# Patient Record
Sex: Female | Born: 1990
Health system: Southern US, Community
[De-identification: ages and names within clinical notes are randomized; demographics above are authoritative.]

## PROBLEM LIST (undated history)

## (undated) ENCOUNTER — Inpatient Hospital Stay (HOSPITAL_COMMUNITY): Payer: Self-pay

## (undated) DIAGNOSIS — N289 Disorder of kidney and ureter, unspecified: Secondary | ICD-10-CM

## (undated) DIAGNOSIS — IMO0002 Reserved for concepts with insufficient information to code with codable children: Secondary | ICD-10-CM

## (undated) DIAGNOSIS — E669 Obesity, unspecified: Secondary | ICD-10-CM

## (undated) DIAGNOSIS — R87619 Unspecified abnormal cytological findings in specimens from cervix uteri: Secondary | ICD-10-CM

## (undated) DIAGNOSIS — A749 Chlamydial infection, unspecified: Secondary | ICD-10-CM

## (undated) DIAGNOSIS — R87629 Unspecified abnormal cytological findings in specimens from vagina: Secondary | ICD-10-CM

## (undated) DIAGNOSIS — I10 Essential (primary) hypertension: Secondary | ICD-10-CM

## (undated) DIAGNOSIS — N200 Calculus of kidney: Secondary | ICD-10-CM

## (undated) HISTORY — PX: NO PAST SURGERIES: SHX2092

---
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
## 2009-05-26 ENCOUNTER — Emergency Department (HOSPITAL_COMMUNITY): Admission: EM | Admit: 2009-05-26 | Discharge: 2009-05-26 | Payer: Self-pay | Admitting: Emergency Medicine

## 2009-08-31 ENCOUNTER — Emergency Department (HOSPITAL_COMMUNITY): Admission: EM | Admit: 2009-08-31 | Discharge: 2009-08-31 | Payer: Self-pay | Admitting: Emergency Medicine

## 2010-04-23 ENCOUNTER — Emergency Department (HOSPITAL_COMMUNITY): Admission: EM | Admit: 2010-04-23 | Discharge: 2010-04-23 | Payer: Self-pay | Admitting: Emergency Medicine

## 2011-01-03 LAB — URINALYSIS, ROUTINE W REFLEX MICROSCOPIC
Bilirubin Urine: NEGATIVE
Glucose, UA: NEGATIVE mg/dL
Ketones, ur: NEGATIVE mg/dL
Leukocytes, UA: NEGATIVE
Nitrite: NEGATIVE
Protein, ur: NEGATIVE mg/dL
Specific Gravity, Urine: 1.023 (ref 1.005–1.030)
Urobilinogen, UA: 0.2 mg/dL (ref 0.0–1.0)
pH: 7 (ref 5.0–8.0)

## 2011-01-03 LAB — URINE MICROSCOPIC-ADD ON

## 2011-01-03 LAB — WET PREP, GENITAL

## 2011-01-03 LAB — RPR: RPR Ser Ql: NONREACTIVE

## 2011-01-03 LAB — GC/CHLAMYDIA PROBE AMP, GENITAL: Chlamydia, DNA Probe: NEGATIVE

## 2011-01-20 LAB — URINALYSIS, ROUTINE W REFLEX MICROSCOPIC
Bilirubin Urine: NEGATIVE
Ketones, ur: NEGATIVE mg/dL
Nitrite: NEGATIVE
Urobilinogen, UA: 0.2 mg/dL (ref 0.0–1.0)

## 2011-01-20 LAB — COMPREHENSIVE METABOLIC PANEL
Albumin: 3.3 g/dL — ABNORMAL LOW (ref 3.5–5.2)
Alkaline Phosphatase: 38 U/L — ABNORMAL LOW (ref 39–117)
BUN: 9 mg/dL (ref 6–23)
Chloride: 107 mEq/L (ref 96–112)
Glucose, Bld: 93 mg/dL (ref 70–99)
Potassium: 3.8 mEq/L (ref 3.5–5.1)
Total Bilirubin: 0.5 mg/dL (ref 0.3–1.2)

## 2011-01-20 LAB — CBC
HCT: 29.5 % — ABNORMAL LOW (ref 36.0–46.0)
Hemoglobin: 9.9 g/dL — ABNORMAL LOW (ref 12.0–15.0)
Platelets: 230 10*3/uL (ref 150–400)
WBC: 5.8 10*3/uL (ref 4.0–10.5)

## 2011-01-20 LAB — DIFFERENTIAL
Basophils Absolute: 0.1 10*3/uL (ref 0.0–0.1)
Eosinophils Absolute: 0.1 10*3/uL (ref 0.0–0.7)
Lymphocytes Relative: 19 % (ref 12–46)
Monocytes Absolute: 0.4 10*3/uL (ref 0.1–1.0)
Neutro Abs: 4.1 10*3/uL (ref 1.7–7.7)

## 2011-01-20 LAB — PREGNANCY, URINE: Preg Test, Ur: NEGATIVE

## 2011-01-23 LAB — POCT RAPID STREP A (OFFICE): Streptococcus, Group A Screen (Direct): POSITIVE — AB

## 2011-01-29 ENCOUNTER — Emergency Department (HOSPITAL_COMMUNITY)
Admission: EM | Admit: 2011-01-29 | Discharge: 2011-01-29 | Disposition: A | Discharge status: Home / Self Care | Payer: Self-pay | Attending: Emergency Medicine | Admitting: Emergency Medicine

## 2011-01-29 DIAGNOSIS — L509 Urticaria, unspecified: Secondary | ICD-10-CM | POA: Insufficient documentation

## 2011-01-29 DIAGNOSIS — R21 Rash and other nonspecific skin eruption: Secondary | ICD-10-CM | POA: Insufficient documentation

## 2011-01-29 DIAGNOSIS — E669 Obesity, unspecified: Secondary | ICD-10-CM | POA: Insufficient documentation

## 2011-03-27 ENCOUNTER — Inpatient Hospital Stay (INDEPENDENT_AMBULATORY_CARE_PROVIDER_SITE_OTHER)
Admission: RE | Admit: 2011-03-27 | Discharge: 2011-03-27 | Disposition: A | Discharge status: Home / Self Care | Payer: Self-pay | Source: Ambulatory Visit | Attending: Emergency Medicine | Admitting: Emergency Medicine

## 2011-03-27 DIAGNOSIS — N760 Acute vaginitis: Secondary | ICD-10-CM

## 2011-03-27 LAB — POCT URINALYSIS DIP (DEVICE)
Hgb urine dipstick: NEGATIVE
Protein, ur: 30 mg/dL — AB
Specific Gravity, Urine: >=1.03 (ref 1.005–1.030)
Urobilinogen, UA: 0.2 mg/dL (ref 0.0–1.0)
pH: 6 (ref 5.0–8.0)

## 2011-03-27 LAB — POCT PREGNANCY, URINE: Preg Test, Ur: NEGATIVE

## 2011-03-27 LAB — WET PREP, GENITAL

## 2011-03-30 LAB — GC/CHLAMYDIA PROBE AMP, GENITAL
Chlamydia, DNA Probe: POSITIVE — AB
GC Probe Amp, Genital: NEGATIVE

## 2011-09-01 ENCOUNTER — Encounter: Payer: Self-pay | Admitting: *Deleted

## 2011-09-01 ENCOUNTER — Emergency Department (HOSPITAL_COMMUNITY)
Admission: EM | Admit: 2011-09-01 | Discharge: 2011-09-01 | Disposition: A | Discharge status: Home / Self Care | Payer: Self-pay | Attending: Emergency Medicine | Admitting: Emergency Medicine

## 2011-09-01 DIAGNOSIS — R112 Nausea with vomiting, unspecified: Secondary | ICD-10-CM | POA: Insufficient documentation

## 2011-09-01 DIAGNOSIS — D649 Anemia, unspecified: Secondary | ICD-10-CM | POA: Insufficient documentation

## 2011-09-01 DIAGNOSIS — R1084 Generalized abdominal pain: Secondary | ICD-10-CM | POA: Insufficient documentation

## 2011-09-01 LAB — CBC
Platelets: 234 10*3/uL (ref 150–400)
RBC: 3.89 MIL/uL (ref 3.87–5.11)
RDW: 15 % (ref 11.5–15.5)
WBC: 5.7 10*3/uL (ref 4.0–10.5)

## 2011-09-01 LAB — URINALYSIS, ROUTINE W REFLEX MICROSCOPIC
Glucose, UA: NEGATIVE mg/dL
Hgb urine dipstick: NEGATIVE
Ketones, ur: NEGATIVE mg/dL
Protein, ur: NEGATIVE mg/dL
pH: 7 (ref 5.0–8.0)

## 2011-09-01 LAB — COMPREHENSIVE METABOLIC PANEL
ALT: 10 U/L (ref 0–35)
AST: 14 U/L (ref 0–37)
Albumin: 3.4 g/dL — ABNORMAL LOW (ref 3.5–5.2)
CO2: 25 mEq/L (ref 19–32)
Chloride: 105 mEq/L (ref 96–112)
GFR calc non Af Amer: >90 mL/min (ref >90)
Potassium: 3.8 mEq/L (ref 3.5–5.1)
Sodium: 137 mEq/L (ref 135–145)
Total Bilirubin: 0.3 mg/dL (ref 0.3–1.2)

## 2011-09-01 LAB — POCT PREGNANCY, URINE: Preg Test, Ur: NEGATIVE

## 2011-09-01 MED ORDER — ONDANSETRON 4 MG PO TBDP
8.0000 mg | ORAL_TABLET | Freq: Once | ORAL | Status: AC
  Administered 2011-09-01: 8 mg via ORAL
  Filled 2011-09-01: qty 2

## 2011-09-01 MED ORDER — ONDANSETRON HCL 4 MG PO TABS: 4.0000 mg | ORAL_TABLET | Freq: Four times a day (QID) | ORAL | Status: AC

## 2011-09-01 NOTE — ED Provider Notes (Signed)
History     CSN: [id number] Arrival date & time: 09/01/2011  8:50 AM   First MD Initiated Contact with Patient 09/01/11 [phone]      Chief Complaint  Patient presents with  . Abdominal Pain  . Emesis    (Consider location/radiation/quality/duration/timing/severity/associated sxs/prior treatment) Patient is a 20 y.o. female presenting with abdominal pain. The history is provided by the patient and the spouse.  Abdominal Pain The primary symptoms of the illness include abdominal pain, nausea and vomiting. The primary symptoms of the illness do not include fever. Primary symptoms comment: Vomiting and abdominal cramping The current episode started 13 to 24 hours ago. The onset of the illness was sudden. Progression since onset: The abdominal cramping has stayed the same lobe the vomiting has not occurred since 9 AM.  The abdominal pain began 13 to24 hours ago. The pain came on suddenly. The abdominal pain has been unchanged since its onset. The abdominal pain is generalized. The abdominal pain does not radiate. Pain scale: Pain is moderate. The abdominal pain is relieved by nothing. The abdominal pain is exacerbated by vomiting.  Associated with: Associated with nothing. Pregnant Now: Patient does not know she is pregnant. Her menses is one week late and she does not use any protection during intercourse. Symptoms associated with the illness do not include chills, anorexia, diaphoresis, heartburn, constipation, urgency, hematuria, frequency or back pain. Associated medical issues comments: Patient has no associated medical issues..    History reviewed. No pertinent past medical history.  History reviewed. No pertinent past surgical history.  History reviewed. No pertinent family history.  History  Substance Use Topics  . Smoking status: Current Everyday Smoker -- 0.5 packs/day    Types: Cigarettes  . Smokeless tobacco: Not on file  . Alcohol Use: No     Review of Systems    Constitutional: Negative for fever, chills and diaphoresis.  HENT: Negative for hearing loss, congestion, neck pain and neck stiffness.   Eyes: Negative for pain and visual disturbance.  Respiratory: Negative for cough and chest tightness.   Cardiovascular: Negative for chest pain, palpitations and leg swelling.  Gastrointestinal: Positive for nausea, vomiting and abdominal pain. Negative for heartburn, constipation and anorexia.  Genitourinary: Negative for urgency, frequency and hematuria.  Musculoskeletal: Negative for back pain.  Skin: Negative for pallor, rash and wound.  Neurological: Negative for seizures, syncope, weakness and headaches.  Hematological: Does not bruise/bleed easily.  Psychiatric/Behavioral: Negative for behavioral problems and confusion.    Allergies  Review of patient's allergies indicates no known allergies.  Home Medications  No current outpatient prescriptions on file.  BP 139/86  Pulse 63  Temp(Src) 98 F (36.7 C) (Oral)  Resp 16  SpO2 100%  LMP 07/28/2011  Physical Exam  Constitutional: She is oriented to person, place, and time. She appears well-developed and well-nourished. No distress.  HENT:  Head: Normocephalic and atraumatic.  Mouth/Throat: Oropharynx is clear and moist.  Eyes: Conjunctivae are normal. Pupils are equal, round, and reactive to light.  Neck: Normal range of motion. Neck supple.  Cardiovascular: Normal rate, regular rhythm, normal heart sounds and intact distal pulses.   Pulmonary/Chest: Effort normal and breath sounds normal.  Abdominal: Soft. She exhibits no distension.       There is mild bilateral upper abdominal tenderness to palpation.  Musculoskeletal: Normal range of motion. She exhibits no edema and no tenderness.  Lymphadenopathy:    She has no cervical adenopathy.  Neurological: She is alert and oriented to person,  place, and time. Coordination normal.  Skin: Skin is warm and dry. No rash noted.  Psychiatric:  She has a normal mood and affect. Her behavior is normal.    ED Course  Procedures (including critical care time)  Labs Reviewed  CBC - Abnormal; Notable for the following:    Hemoglobin 10.6 (*)    HCT 32.9 (*)    All other components within normal limits  COMPREHENSIVE METABOLIC PANEL - Abnormal; Notable for the following:    Albumin 3.4 (*)    All other components within normal limits  URINALYSIS, ROUTINE W REFLEX MICROSCOPIC - Abnormal; Notable for the following:    Appearance HAZY (*)    All other components within normal limits  LIPASE, BLOOD  POCT PREGNANCY, URINE  POCT PREGNANCY, URINE   No results found.   1. Nausea and vomiting       MDM  Patient's labs are reviewed and are significant only for mild anemia. Her abdomen is fairly benign on examination. She has not vomited since her arrival to the emergency department. Do not feel her symptoms are related to an emergency medical condition. We will give her a by mouth trial of clear fluids and as long as she tolerates this, we will send her home with a prescription of Zofran and a recommendation to return to the emergency department for further evaluation if she has persistent or worsening symptoms.   Medical screening examination/treatment/procedure(s) were performed by non-physician practitioner and as supervising physician I was immediately available for consultation/collaboration. Osvaldo Human, M.D.     [address] [place], [place] 09/01/11 1416  Carleene Cooper III, MD 09/01/11 [phone]

## 2011-09-01 NOTE — ED Notes (Signed)
Having n/v since last night and generalized abd pain.

## 2011-09-01 NOTE — ED Notes (Signed)
Pt with no vomiting since arrival to dept

## 2011-09-01 NOTE — ED Notes (Signed)
Pt given sprite for PO challenge, tolerating well

## 2011-09-01 NOTE — ED Notes (Addendum)
Pt reports right flank pain radiating into generalized upper abdomen with nausea and mult episodes of vomiting. Denies any urinary symptoms. Reports she ate chips and a candy bar for dinner last night, vomiting began at 10pm. Pt appears in no distress. Alert and oriented x 3. Neuro intact.

## 2011-11-17 ENCOUNTER — Encounter (HOSPITAL_COMMUNITY): Payer: Self-pay | Admitting: *Deleted

## 2011-11-17 ENCOUNTER — Emergency Department (HOSPITAL_COMMUNITY)
Admission: EM | Admit: 2011-11-17 | Discharge: 2011-11-17 | Disposition: A | Discharge status: Home / Self Care | Payer: Self-pay | Attending: Emergency Medicine | Admitting: Emergency Medicine

## 2011-11-17 DIAGNOSIS — N946 Dysmenorrhea, unspecified: Secondary | ICD-10-CM | POA: Insufficient documentation

## 2011-11-17 DIAGNOSIS — N898 Other specified noninflammatory disorders of vagina: Secondary | ICD-10-CM | POA: Insufficient documentation

## 2011-11-17 DIAGNOSIS — N949 Unspecified condition associated with female genital organs and menstrual cycle: Secondary | ICD-10-CM | POA: Insufficient documentation

## 2011-11-17 HISTORY — DX: Obesity, unspecified: E66.9

## 2011-11-17 NOTE — ED Provider Notes (Signed)
History     CSN: [id number]  Arrival date & time 11/17/11  1426   First MD Initiated Contact with Patient 11/17/11 1444      Chief Complaint  Patient presents with  . Vaginal Bleeding    (Consider location/radiation/quality/duration/timing/severity/associated sxs/prior treatment) HPI Comments: Patient presents with complaints of severe pelvic cramping and heavy vaginal bleeding which started 3 days ago but has resolved as of this morning. Her last menses ended on 11/09/2011, lasting 5 days and was normal. She typically has regular cycles with no spotting or breakthrough bleeding. She is sexually active, and is not using any birth control. She denies vaginal discharge, dysuria, nausea vomiting or fevers. She reports she just "wanted to get checked out" since her. The last 3 days has been very  heavy with passage of several clots. She is asymptomatic currently.   Patient is a 21 y.o. female presenting with vaginal bleeding. The history is provided by the patient.  Vaginal Bleeding Pertinent negatives include no abdominal pain, arthralgias, chest pain, congestion, fever, headaches, joint swelling, nausea, neck pain, numbness, rash, sore throat or weakness.    Past Medical History  Diagnosis Date  . Obesity     History reviewed. No pertinent past surgical history.  History reviewed. No pertinent family history.  History  Substance Use Topics  . Smoking status: Former Smoker -- 0.5 packs/day    Types: Cigarettes  . Smokeless tobacco: Not on file  . Alcohol Use: No    OB History    Grav Para Term Preterm Abortions TAB SAB Ect Mult Living                  Review of Systems  Constitutional: Negative for fever.  HENT: Negative for congestion, sore throat and neck pain.   Eyes: Negative.   Respiratory: Negative for chest tightness and shortness of breath.   Cardiovascular: Negative for chest pain.  Gastrointestinal: Negative for nausea and abdominal pain.  Genitourinary:  Positive for vaginal bleeding and menstrual problem. Negative for vaginal discharge and vaginal pain.  Musculoskeletal: Negative for joint swelling and arthralgias.  Skin: Negative.  Negative for rash and wound.  Neurological: Negative for dizziness, weakness, light-headedness, numbness and headaches.  Hematological: Negative.   Psychiatric/Behavioral: Negative.     Allergies  Review of patient's allergies indicates no known allergies.  Home Medications  No current outpatient prescriptions on file.  BP 130/71  Pulse 67  Temp(Src) 98.3 F (36.8 C) (Oral)  Resp 18  SpO2 99%  LMP 11/05/2011  Physical Exam  Nursing note and vitals reviewed. Constitutional: She is oriented to person, place, and time. She appears well-developed and well-nourished.  HENT:  Head: Normocephalic and atraumatic.  Eyes: Conjunctivae are normal.  Neck: Normal range of motion.  Cardiovascular: Normal rate, regular rhythm, normal heart sounds and intact distal pulses.   Pulmonary/Chest: Effort normal and breath sounds normal. She has no wheezes.  Abdominal: Soft. Bowel sounds are normal. She exhibits no distension. There is no tenderness. There is no rebound.  Musculoskeletal: Normal range of motion.  Neurological: She is alert and oriented to person, place, and time.  Skin: Skin is warm and dry.  Psychiatric: She has a normal mood and affect.    ED Course  Procedures (including critical care time)    1. Dysmenorrhea       MDM  Urine pregnancy negative.   Hemoglobin per Istat 11.2.    Patient without symptoms today,  No vaginal bleeding,  Pain or other  symptoms.  Will refer for general GYN care and for recheck if pt develops pattern of dysmenorrhea.        Candis Musa, PA 11/17/11 1652  Candis Musa, PA 11/17/11 1702

## 2011-11-17 NOTE — ED Provider Notes (Signed)
Medical screening examination/treatment/procedure(s) were performed by non-physician practitioner and as supervising physician I was immediately available for consultation/collaboration.  Juliet Rude. Rubin Payor, MD 11/17/11 2355

## 2011-11-17 NOTE — ED Notes (Signed)
Pt reports having severe cramps yesterday and heavy vaginal bleeding with clots.

## 2012-03-22 ENCOUNTER — Emergency Department (HOSPITAL_COMMUNITY)
Admission: EM | Admit: 2012-03-22 | Discharge: 2012-03-22 | Disposition: A | Discharge status: Home / Self Care | Payer: Self-pay | Attending: Emergency Medicine | Admitting: Emergency Medicine

## 2012-03-22 ENCOUNTER — Encounter (HOSPITAL_COMMUNITY): Payer: Self-pay

## 2012-03-22 ENCOUNTER — Emergency Department (HOSPITAL_COMMUNITY): Payer: Self-pay

## 2012-03-22 DIAGNOSIS — Z3490 Encounter for supervision of normal pregnancy, unspecified, unspecified trimester: Secondary | ICD-10-CM

## 2012-03-22 DIAGNOSIS — E669 Obesity, unspecified: Secondary | ICD-10-CM | POA: Insufficient documentation

## 2012-03-22 DIAGNOSIS — R109 Unspecified abdominal pain: Secondary | ICD-10-CM | POA: Insufficient documentation

## 2012-03-22 DIAGNOSIS — N898 Other specified noninflammatory disorders of vagina: Secondary | ICD-10-CM | POA: Insufficient documentation

## 2012-03-22 DIAGNOSIS — B373 Candidiasis of vulva and vagina: Secondary | ICD-10-CM

## 2012-03-22 DIAGNOSIS — F17200 Nicotine dependence, unspecified, uncomplicated: Secondary | ICD-10-CM | POA: Insufficient documentation

## 2012-03-22 DIAGNOSIS — B3731 Acute candidiasis of vulva and vagina: Secondary | ICD-10-CM | POA: Insufficient documentation

## 2012-03-22 DIAGNOSIS — O2390 Unspecified genitourinary tract infection in pregnancy, unspecified trimester: Secondary | ICD-10-CM | POA: Insufficient documentation

## 2012-03-22 HISTORY — DX: Disorder of kidney and ureter, unspecified: N28.9

## 2012-03-22 LAB — URINALYSIS, ROUTINE W REFLEX MICROSCOPIC
Glucose, UA: NEGATIVE mg/dL
Protein, ur: NEGATIVE mg/dL
Specific Gravity, Urine: 1.028 (ref 1.005–1.030)

## 2012-03-22 LAB — POCT PREGNANCY, URINE: Preg Test, Ur: POSITIVE — AB

## 2012-03-22 LAB — WET PREP, GENITAL: Trich, Wet Prep: NONE SEEN

## 2012-03-22 LAB — HCG, QUANTITATIVE, PREGNANCY: hCG, Beta Chain, Quant, S: 962 m[IU]/mL — ABNORMAL HIGH (ref <5)

## 2012-03-22 LAB — URINE MICROSCOPIC-ADD ON

## 2012-03-22 MED ORDER — FLUCONAZOLE 150 MG PO TABS: 150.0000 mg | ORAL_TABLET | Freq: Once | ORAL | Status: DC

## 2012-03-22 MED ORDER — FLUCONAZOLE 150 MG PO TABS: 150.0000 mg | ORAL_TABLET | Freq: Once | ORAL | Status: AC

## 2012-03-22 NOTE — ED Notes (Signed)
Left lower abd pain since Friday states took 3 home preg tests and they were [positive and she wants to be sure LMP was 02/17/12 has a d/c also

## 2012-03-22 NOTE — Discharge Instructions (Signed)
Candida Infection, Adult A candida infection (also called yeast, fungus and Monilia infection) is an overgrowth of yeast that can occur anywhere on the body. A yeast infection commonly occurs in warm, moist body areas. Usually, the infection remains localized but can spread to become a systemic infection. A yeast infection may be a sign of a more severe disease such as diabetes, leukemia, or AIDS. A yeast infection can occur in both men and women. In women, Candida vaginitis is a vaginal infection. It is one of the most common causes of vaginitis. Men usually do not have symptoms or know they have an infection until other problems develop. Men may find out they have a yeast infection because their sex partner has a yeast infection. Uncircumcised men are more likely to get a yeast infection than circumcised men. This is because the uncircumcised glans is not exposed to air and does not remain as dry as that of a circumcised glans. Older adults may develop yeast infections around dentures. CAUSES  Women  Antibiotics.   Steroid medication taken for a long time.   Being overweight (obese).   Diabetes.   Poor immune condition.   Certain serious medical conditions.   Immune suppressive medications for organ transplant patients.   Chemotherapy.   Pregnancy.   Menstration.   Stress and fatigue.   Intravenous drug use.   Oral contraceptives.   Wearing tight-fitting clothes in the crotch area.   Catching it from a sex partner who has a yeast infection.   Spermicide.   Intravenous, urinary, or other catheters.  Men  Catching it from a sex partner who has a yeast infection.   Having oral or anal sex with a person who has the infection.   Spermicide.   Diabetes.   Antibiotics.   Poor immune system.   Medications that suppress the immune system.   Intravenous drug use.   Intravenous, urinary, or other catheters.  SYMPTOMS  Women  Thick, white vaginal discharge.    Vaginal itching.   Redness and swelling in and around the vagina.   Irritation of the lips of the vagina and perineum.   Blisters on the vaginal lips and perineum.   Painful sexual intercourse.   Low blood sugar (hypoglycemia).   Painful urination.   Bladder infections.   Intestinal problems such as constipation, indigestion, bad breath, bloating, increase in gas, diarrhea, or loose stools.  Men  Men may develop intestinal problems such as constipation, indigestion, bad breath, bloating, increase in gas, diarrhea, or loose stools.   Dry, cracked skin on the penis with itching or discomfort.   Jock itch.   Dry, flaky skin.   Athlete's foot.   Hypoglycemia.  DIAGNOSIS  Women  A history and an exam are performed.   The discharge may be examined under a microscope.   A culture may be taken of the discharge.  Men  A history and an exam are performed.   Any discharge from the penis or areas of cracked skin will be looked at under the microscope and cultured.   Stool samples may be cultured.  TREATMENT  Women  Vaginal antifungal suppositories and creams.   Medicated creams to decrease irritation and itching on the outside of the vagina.   Warm compresses to the perineal area to decrease swelling and discomfort.   Oral antifungal medications.   Medicated vaginal suppositories or cream for repeated or recurrent infections.   Wash and dry the irritation areas before applying the cream.     Eating yogurt with lactobacillus may help with prevention and treatment.   Sometimes painting the vagina with gentian violet solution may help if creams and suppositories do not work.  Men  Antifungal creams and oral antifungal medications.   Sometimes treatment must continue for 30 days after the symptoms go away to prevent recurrence.  HOME CARE INSTRUCTIONS  Women  Use cotton underwear and avoid tight-fitting clothing.   Avoid colored, scented toilet paper and  deodorant tampons or pads.   Do not douche.   Keep your diabetes under control.   Finish all the prescribed medications.   Keep your skin clean and dry.   Consume milk or yogurt with lactobacillus active culture regularly. If you get frequent yeast infections and think that is what the infection is, there are over-the-counter medications that you can get. If the infection does not show healing in 3 days, talk to your caregiver.   Tell your sex partner you have a yeast infection. Your partner may need treatment also, especially if your infection does not clear up or recurs.  Men  Keep your skin clean and dry.   Keep your diabetes under control.   Finish all prescribed medications.   Tell your sex partner that you have a yeast infection so they can be treated if necessary.  SEEK MEDICAL CARE IF:   Your symptoms do not clear up or worsen in one week after treatment.   You have an oral temperature above 102 F (38.9 C).   You have trouble swallowing or eating for a prolonged time.   You develop blisters on and around your vagina.   You develop vaginal bleeding and it is not your menstrual period.   You develop abdominal pain.   You develop intestinal problems as mentioned above.   You get weak or lightheaded.   You have painful or increased urination.   You have pain during sexual intercourse.  MAKE SURE YOU:   Understand these instructions.   Will watch your condition.   Will get help right away if you are not doing well or get worse.  Document Released: 11/11/2004 Document Revised: 09/23/2011 Document Reviewed: 02/23/2010 [facility] Patient Information 2012 [place], [place].ABCs of Pregnancy A Antepartum care is very important. Be sure you see your doctor and get prenatal care as soon as you think you are pregnant. At this time, you will be tested for infection, genetic abnormalities and potential problems with you and the pregnancy. This is the time to discuss diet,  exercise, work, medications, labor, pain medication during labor and the possibility of a cesarean delivery. Ask any questions that may concern you. It is important to see your doctor regularly throughout your pregnancy. Avoid exposure to toxic substances and chemicals - such as cleaning solvents, lead and mercury, some insecticides, and paint. Pregnant women should avoid exposure to paint fumes, and fumes that cause you to feel ill, dizzy or faint. When possible, it is a good idea to have a pre-pregnancy consultation with your caregiver to begin some important recommendations your caregiver suggests such as, taking folic acid, exercising, quitting smoking, avoiding alcoholic beverages, etc. B Breastfeeding is the healthiest choice for both you and your baby. It has many nutritional benefits for the baby and health benefits for the mother. It also creates a very tight and loving bond between the baby and mother. Talk to your doctor, your family and friends, and your employer about how you choose to feed your baby and how they can support you  in your decision. Not all birth defects can be prevented, but a woman can take actions that may increase her chance of having a healthy baby. Many birth defects happen very early in pregnancy, sometimes before a woman even knows she is pregnant. Birth defects or abnormalities of any child in your or the father's family should be discussed with your caregiver. Get a good support bra as your breast size changes. Wear it especially when you exercise and when nursing.  C Celebrate the news of your pregnancy with the your spouse/father and family. Childbirth classes are helpful to take for you and the spouse/father because it helps to understand what happens during the pregnancy, labor and delivery. Cesarean delivery should be discussed with your doctor so you are prepared for that possibility. The pros and cons of circumcision if it is a boy, should be discussed with your  pediatrician. Cigarette smoking during pregnancy can result in low birth weight babies. It has been associated with infertility, miscarriages, tubal pregnancies, infant death (mortality) and poor health (morbidity) in childhood. Additionally, cigarette smoking may cause long-term learning disabilities. If you smoke, you should try to quit before getting pregnant and not smoke during the pregnancy. Secondary smoke may also harm a mother and her developing baby. It is a good idea to ask people to stop smoking around you during your pregnancy and after the baby is born. Extra calcium is necessary when you are pregnant and is found in your prenatal vitamin, in dairy products, green leafy vegetables and in calcium supplements. D A healthy diet according to your current weight and height, along with vitamins and mineral supplements should be discussed with your caregiver. Domestic abuse or violence should be made known to your doctor right away to get the situation corrected. Drink more water when you exercise to keep hydrated. Discomfort of your back and legs usually develops and progresses from the middle of the second trimester through to delivery of the baby. This is because of the enlarging baby and uterus, which may also affect your balance. Do not take illegal drugs. Illegal drugs can seriously harm the baby and you. Drink extra fluids (water is best) throughout pregnancy to help your body keep up with the increases in your blood volume. Drink at least 6 to 8 glasses of water, fruit juice, or milk each day. A good way to know you are drinking enough fluid is when your urine looks almost like clear water or is very light yellow.  E Eat healthy to get the nutrients you and your unborn baby need. Your meals should include the five basic food groups. Exercise (30 minutes of light to moderate exercise a day) is important and encouraged during pregnancy, if there are no medical problems or problems with the  pregnancy. Exercise that causes discomfort or dizziness should be stopped and reported to your caregiver. Emotions during pregnancy can change from being ecstatic to depression and should be understood by you, your partner and your family. F Fetal screening with ultrasound, amniocentesis and monitoring during pregnancy and labor is common and sometimes necessary. Take 400 micrograms of folic acid daily both before, when possible, and during the first few months of pregnancy to reduce the risk of birth defects of the brain and spine. All women who could possibly become pregnant should take a vitamin with folic acid, every day. It is also important to eat a healthy diet with fortified foods (enriched grain products, including cereals, rice, breads, and pastas) and foods with  natural sources of folate (orange juice, green leafy vegetables, beans, peanuts, broccoli, asparagus, peas, and lentils). The father should be involved with all aspects of the pregnancy including, the prenatal care, childbirth classes, labor, delivery, and postpartum time. Fathers may also have emotional concerns about being a father, financial needs, and raising a family. G Genetic testing should be done appropriately. It is important to know your family and the father's history. If there have been problems with pregnancies or birth defects in your family, report these to your doctor. Also, genetic counselors can talk with you about the information you might need in making decisions about having a family. You can call a major medical center in your area for help in finding a board-certified genetic counselor. Genetic testing and counseling should be done before pregnancy when possible, especially if there is a history of problems in the mother's or father's family. Certain ethnic backgrounds are more at risk for genetic defects. H Get familiar with the hospital where you will be having your baby. Get to know how long it takes to get there,  the labor and delivery area, and the hospital procedures. Be sure your medical insurance is accepted there. Get your home ready for the baby including, clothes, the baby's room (when possible), furniture and car seat. Hand washing is important throughout the day, especially after handling raw meat and poultry, changing the baby's diaper or using the bathroom. This can help prevent the spread of many bacteria and viruses that cause infection. Your hair may become dry and thinner, but will return to normal a few weeks after the baby is born. Heartburn is a common problem that can be treated by taking antacids recommended by your caregiver, eating smaller meals 5 or 6 times a day, not drinking liquids when eating, drinking between meals and raising the head of your bed 2 to 3 inches. I Insurance to cover you, the baby, doctor and hospital should be reviewed so that you will be prepared to pay any costs not covered by your insurance plan. If you do not have medical insurance, there are usually clinics and services available for you in your community. Take 30 milligrams of iron during your pregnancy as prescribed by your doctor to reduce the risk of low red blood cells (anemia) later in pregnancy. All women of childbearing age should eat a diet rich in iron. J There should be a joint effort for the mother, father and any other children to adapt to the pregnancy financially, emotionally, and psychologically during the pregnancy. Join a support group for moms-to-be. Or, join a class on parenting or childbirth. Have the family participate when possible. K Know your limits. Let your caregiver know if you experience any of the following:   Pain of any kind.   Strong cramps.   You develop a lot of weight in a short period of time (5 pounds in 3 to 5 days).   Vaginal bleeding, leaking of amniotic fluid.   Headache, vision problems.   Dizziness, fainting, shortness of breath.   Chest pain.   Fever of 102 F  (38.9 C) or higher.   Gush of clear fluid from your vagina.   Painful urination.   Domestic violence.   Irregular heartbeat (palpitations).   Rapid beating of the heart (tachycardia).   Constant feeling sick to your stomach (nauseous) and vomiting.   Trouble walking, fluid retention (edema).   Muscle weakness.   If your baby has decreased activity.   Persistent diarrhea.  Abnormal vaginal discharge.   Uterine contractions at 20-minute intervals.   Back pain that travels down your leg.  L Learn and practice that what you eat and drink should be in moderation and healthy for you and your baby. Legal drugs such as alcohol and caffeine are important issues for pregnant women. There is no safe amount of alcohol a woman can drink while pregnant. Fetal alcohol syndrome, a disorder characterized by growth retardation, facial abnormalities, and central nervous system dysfunction, is caused by a woman's use of alcohol during pregnancy. Caffeine, found in tea, coffee, soft drinks and chocolate, should also be limited. Be sure to read labels when trying to cut down on caffeine during pregnancy. More than 200 foods, beverages, and over-the-counter medications contain caffeine and have a high salt content! There are coffees and teas that do not contain caffeine. M Medical conditions such as diabetes, epilepsy, and high blood pressure should be treated and kept under control before pregnancy when possible, but especially during pregnancy. Ask your caregiver about any medications that may need to be changed or adjusted during pregnancy. If you are currently taking any medications, ask your caregiver if it is safe to take them while you are pregnant or before getting pregnant when possible. Also, be sure to discuss any herbs or vitamins you are taking. They are medicines, too! Discuss with your doctor all medications, prescribed and over-the-counter, that you are taking. During your prenatal visit,  discuss the medications your doctor may give you during labor and delivery. N Never be afraid to ask your doctor or caregiver questions about your health, the progress of the pregnancy, family problems, stressful situations, and recommendation for a pediatrician, if you do not have one. It is better to take all precautions and discuss any questions or concerns you may have during your office visits. It is a good idea to write down your questions before you visit the doctor. O Over-the-counter cough and cold remedies may contain alcohol or other ingredients that should be avoided during pregnancy. Ask your caregiver about prescription, herbs or over-the-counter medications that you are taking or may consider taking while pregnant.  P Physical activity during pregnancy can benefit both you and your baby by lessening discomfort and fatigue, providing a sense of well-being, and increasing the likelihood of early recovery after delivery. Light to moderate exercise during pregnancy strengthens the belly (abdominal) and back muscles. This helps improve posture. Practicing yoga, walking, swimming, and cycling on a stationary bicycle are usually safe exercises for pregnant women. Avoid scuba diving, exercise at high altitudes (over 3000 feet), skiing, horseback riding, contact sports, etc. Always check with your doctor before beginning any kind of exercise, especially during pregnancy and especially if you did not exercise before getting pregnant. Q Queasiness, stomach upset and morning sickness are common during pregnancy. Eating a couple of crackers or dry toast before getting out of bed. Foods that you normally love may make you feel sick to your stomach. You may need to substitute other nutritious foods. Eating 5 or 6 small meals a day instead of 3 large ones may make you feel better. Do not drink with your meals, drink between meals. Questions that you have should be written down and asked during your prenatal  visits. R Read about and make plans to baby-proof your home. There are important tips for making your home a safer environment for your baby. Review the tips and make your home safer for you and your baby. Read food labels  regarding calories, salt and fat content in the food. S Saunas, hot tubs, and steam rooms should be avoided while you are pregnant. Excessive high heat may be harmful during your pregnancy. Your caregiver will screen and examine you for sexually transmitted diseases and genetic disorders during your prenatal visits. Learn the signs of labor. Sexual relations while pregnant is safe unless there is a medical or pregnancy problem and your caregiver advises against it. T Traveling long distances should be avoided especially in the third trimester of your pregnancy. If you do have to travel out of state, be sure to take a copy of your medical records and medical insurance plan with you. You should not travel long distances without seeing your doctor first. Most airlines will not allow you to travel after 36 weeks of pregnancy. Toxoplasmosis is an infection caused by a parasite that can seriously harm an unborn baby. Avoid eating undercooked meat and handling cat litter. Be sure to wear gloves when gardening. Tingling of the hands and fingers is not unusual and is due to fluid retention. This will go away after the baby is born. U Womb (uterus) size increases during the first trimester. Your kidneys will begin to function more efficiently. This may cause you to feel the need to urinate more often. You may also leak urine when sneezing, coughing or laughing. This is due to the growing uterus pressing against your bladder, which lies directly in front of and slightly under the uterus during the first few months of pregnancy. If you experience burning along with frequency of urination or bloody urine, be sure to tell your doctor. The size of your uterus in the third trimester may cause a problem  with your balance. It is advisable to maintain good posture and avoid wearing high heels during this time. An ultrasound of your baby may be necessary during your pregnancy and is safe for you and your baby. V Vaccinations are an important concern for pregnant women. Get needed vaccines before pregnancy. Center for Disease Control ([employer]) has clear guidelines for the use of vaccines during pregnancy. Review the list, be sure to discuss it with your doctor. Prenatal vitamins are helpful and healthy for you and the baby. Do not take extra vitamins except what is recommended. Taking too much of certain vitamins can cause overdose problems. Continuous vomiting should be reported to your caregiver. Varicose veins may appear especially if there is a family history of varicose veins. They should subside after the delivery of the baby. Support hose helps if there is leg discomfort. W Being overweight or underweight during pregnancy may cause problems. Try to get within 15 pounds of your ideal weight before pregnancy. Remember, pregnancy is not a time to be dieting! Do not stop eating or start skipping meals as your weight increases. Both you and your baby need the calories and nutrition you receive from a healthy diet. Be sure to consult with your doctor about your diet. There is a formula and diet plan available depending on whether you are overweight or underweight. Your caregiver or nutritionist can help and advise you if necessary. X Avoid X-rays. If you must have dental work or diagnostic tests, tell your dentist or physician that you are pregnant so that extra care can be taken. X-rays should only be taken when the risks of not taking them outweigh the risk of taking them. If needed, only the minimum amount of radiation should be used. When X-rays are necessary, protective lead shields should  be used to cover areas of the body that are not being X-rayed. Y Your baby loves you. Breastfeeding your baby  creates a loving and very close bond between the two of you. Give your baby a healthy environment to live in while you are pregnant. Infants and children require constant care and guidance. Their health and safety should be carefully watched at all times. After the baby is born, rest or take a nap when the baby is sleeping. Z Get your ZZZs. Be sure to get plenty of rest. Resting on your side as often as possible, especially on your left side is advised. It provides the best circulation to your baby and helps reduce swelling. Try taking a nap for 30 to 45 minutes in the afternoon when possible. After the baby is born rest or take a nap when the baby is sleeping. Try elevating your feet for that amount of time when possible. It helps the circulation in your legs and helps reduce swelling.  Most information courtesy of the CDC. Document Released: 10/04/2005 Document Revised: 09/23/2011 Document Reviewed: 06/18/2009 [facility] Patient Information 2012 [place], [place].

## 2012-03-22 NOTE — ED Notes (Signed)
Pt. Took a home pregnancy test on Friday which is positive , developed sharp vaginal pain with discharge no bleeding

## 2012-03-22 NOTE — ED Provider Notes (Signed)
Medical screening examination/treatment/procedure(s) were performed by non-physician practitioner and as supervising physician I was immediately available for consultation/collaboration.   Forbes Cellar, MD 03/22/12 1309

## 2012-03-22 NOTE — ED Notes (Signed)
Pelvic done w/speculum and bimanual spec to lab 

## 2012-03-22 NOTE — ED Notes (Signed)
Back from us

## 2012-03-22 NOTE — ED Provider Notes (Signed)
History     CSN: [id number]  Arrival date & time 03/22/12  0918   First MD Initiated Contact with Patient 03/22/12 [phone]      Chief Complaint  Patient presents with  . Vaginal Discharge    (Consider location/radiation/quality/duration/timing/severity/associated sxs/prior treatment) Patient is a 21 y.o. female presenting with abdominal pain. The history is provided by the patient. No language interpreter was used.  Abdominal Pain The primary symptoms of the illness include abdominal pain and vaginal discharge. The current episode started 13 to 24 hours ago. The onset of the illness was gradual. The problem has been gradually worsening.  The patient states that she believes she is currently pregnant. The patient has not had a change in bowel habit. Symptoms associated with the illness do not include constipation. Significant associated medical issues do not include diverticulitis.  Pt complains of abdominal pain and vaginal discharge.  Pt had positive home pregnancy.  No prenatal care  Past Medical History  Diagnosis Date  . Obesity   . Renal disorder     History reviewed. No pertinent past surgical history.  No family history on file.  History  Substance Use Topics  . Smoking status: Current Everyday Smoker -- 0.5 packs/day    Types: Cigarettes  . Smokeless tobacco: Not on file  . Alcohol Use: Yes    OB History    Grav Para Term Preterm Abortions TAB SAB Ect Mult Living                  Review of Systems  Gastrointestinal: Positive for abdominal pain. Negative for constipation.  Genitourinary: Positive for vaginal discharge.  All other systems reviewed and are negative.    Allergies  Review of patient's allergies indicates no known allergies.  Home Medications  No current outpatient prescriptions on file.  BP 113/76  Pulse 82  Temp(Src) 98.6 F (37 C) (Oral)  Resp 18  SpO2 100%  LMP 02/17/2012  Physical Exam  Nursing note and vitals  reviewed. Constitutional: She is oriented to person, place, and time. She appears well-developed and well-nourished.  HENT:  Head: Normocephalic and atraumatic.  Right Ear: External ear normal.  Left Ear: External ear normal.  Nose: Nose normal.  Mouth/Throat: Oropharynx is clear and moist.  Eyes: Conjunctivae and EOM are normal. Pupils are equal, round, and reactive to light.  Neck: Normal range of motion. Neck supple.  Cardiovascular: Normal rate and normal heart sounds.   Pulmonary/Chest: Effort normal and breath sounds normal.  Abdominal: Soft. There is tenderness.  Genitourinary: Vaginal discharge found.  Musculoskeletal: Normal range of motion.  Neurological: She is alert and oriented to person, place, and time. She has normal reflexes.  Skin: Skin is warm.  Psychiatric: She has a normal mood and affect.    ED Course  Procedures (including critical care time)  Labs Reviewed - No data to display US Ob Comp Less 14 Wks  03/22/2012  *RADIOLOGY REPORT*  Clinical Data: First trimester pregnancy.  Pelvic pain.  LMP 02/21/2012.  OBSTETRIC <14 WK [place] AND TRANSVAGINAL OB US  Technique:  Both transabdominal and transvaginal ultrasound examinations were performed for complete evaluation of the gestation as well as the maternal uterus, adnexal regions, and pelvic cul-de-sac.  Transvaginal technique was performed to assess early pregnancy.  Comparison:  None.  Intrauterine gestational sac:  There is a small intrauterine gestational sac within the lower uterine segment.  There is surrounding fluid within the endometrial canal. Yolk sac: There is a probable small  yolk sac. Embryo: Not visualized.  MSD: 3.7 mm; 5 weeks 1 day          [place] EDC: 11/21/2012  Maternal uterus/adnexae: There is no suspicious adnexal finding.  A small corpus luteal cyst is present on the left.  A small amount of free pelvic fluid is within physiologic limits.  IMPRESSION: Single intrauterine gestational sac with mean sac  diameter corresponding with a gestational age of [redacted] weeks 1 day.  No fetal pole is demonstrated.  A fetal pole is not necessarily visualized this early in pregnancy, and the findings may be due to early gestation.  A blighted ovum is not excluded.  Follow-up beta HCG levels and consideration of follow-up ultrasound in 7-10 days are recommended to assess fetal viability.  No suspicious adnexal findings.  Original Report Authenticated By: Gerrianne Scale, M.D.   US Ob Transvaginal  03/22/2012  *RADIOLOGY REPORT*  Clinical Data: First trimester pregnancy.  Pelvic pain.  LMP 02/21/2012.  OBSTETRIC <14 WK [place] AND TRANSVAGINAL OB US  Technique:  Both transabdominal and transvaginal ultrasound examinations were performed for complete evaluation of the gestation as well as the maternal uterus, adnexal regions, and pelvic cul-de-sac.  Transvaginal technique was performed to assess early pregnancy.  Comparison:  None.  Intrauterine gestational sac:  There is a small intrauterine gestational sac within the lower uterine segment.  There is surrounding fluid within the endometrial canal. Yolk sac: There is a probable small yolk sac. Embryo: Not visualized.  MSD: 3.7 mm; 5 weeks 1 day          [place] EDC: 11/21/2012  Maternal uterus/adnexae: There is no suspicious adnexal finding.  A small corpus luteal cyst is present on the left.  A small amount of free pelvic fluid is within physiologic limits.  IMPRESSION: Single intrauterine gestational sac with mean sac diameter corresponding with a gestational age of [redacted] weeks 1 day.  No fetal pole is demonstrated.  A fetal pole is not necessarily visualized this early in pregnancy, and the findings may be due to early gestation.  A blighted ovum is not excluded.  Follow-up beta HCG levels and consideration of follow-up ultrasound in 7-10 days are recommended to assess fetal viability.  No suspicious adnexal findings.  Original Report Authenticated By: Gerrianne Scale, M.D.     No  diagnosis found.    MDM  Ultrasound shows gest sac. Radiologist advised repeat ultrasound in 1 week.  Pt advised to go to [facility] hospital for recheck in 1 week.  I will treat yeast with diflucan        Elson Areas, [place] 03/22/12 1206

## 2012-03-23 LAB — GC/CHLAMYDIA PROBE AMP, GENITAL: Chlamydia, DNA Probe: NEGATIVE

## 2012-03-31 ENCOUNTER — Encounter (HOSPITAL_COMMUNITY): Payer: Self-pay

## 2012-03-31 ENCOUNTER — Inpatient Hospital Stay (HOSPITAL_COMMUNITY): Payer: Self-pay

## 2012-03-31 ENCOUNTER — Inpatient Hospital Stay (HOSPITAL_COMMUNITY)
Admission: AD | Admit: 2012-03-31 | Discharge: 2012-03-31 | Disposition: A | Discharge status: Home / Self Care | Payer: Self-pay | Source: Ambulatory Visit | Attending: Obstetrics & Gynecology | Admitting: Obstetrics & Gynecology

## 2012-03-31 DIAGNOSIS — O4690 Antepartum hemorrhage, unspecified, unspecified trimester: Secondary | ICD-10-CM

## 2012-03-31 DIAGNOSIS — O200 Threatened abortion: Secondary | ICD-10-CM | POA: Insufficient documentation

## 2012-03-31 HISTORY — DX: Reserved for concepts with insufficient information to code with codable children: IMO0002

## 2012-03-31 HISTORY — DX: Unspecified abnormal cytological findings in specimens from cervix uteri: R87.619

## 2012-03-31 LAB — URINALYSIS, ROUTINE W REFLEX MICROSCOPIC
Bilirubin Urine: NEGATIVE
Nitrite: NEGATIVE
Protein, ur: NEGATIVE mg/dL
Urobilinogen, UA: 0.2 mg/dL (ref 0.0–1.0)

## 2012-03-31 LAB — CBC
MCH: 27.4 pg (ref 26.0–34.0)
MCV: 84.5 fL (ref 78.0–100.0)
Platelets: 207 10*3/uL (ref 150–400)
RDW: 15.5 % (ref 11.5–15.5)

## 2012-03-31 LAB — URINE MICROSCOPIC-ADD ON

## 2012-03-31 NOTE — MAU Note (Signed)
Pt approx [redacted] weeks gestation stating that started having some vaginal spotting at 0500 and since has had some heavier vaginal bleeding, pt describing as 3 little strips of red on a peripad.  Pt denies any pain at this time.

## 2012-03-31 NOTE — MAU Provider Note (Signed)
History     CSN: [id number]  Arrival date and time: 03/31/12 1247   First Provider Initiated Contact with Patient 03/31/12 1328      Chief Complaint  Patient presents with  . Vaginal Bleeding   HPI Kaitlin Jackson is 21 y.o. G1P0 [redacted]w[redacted]d weeks presenting with vaginal bleeding that began this am at 5.  Was seen 6/7 at [facility]  with + UPT, BHCG 963, and U/S showing IUGS at [redacted]w[redacted]d.  She denies pain.      Past Medical History  Diagnosis Date  . Obesity   . Renal disorder   . Abnormal Pap smear     Past Surgical History  Procedure Date  . No past surgeries     Family History  Problem Relation Age of Onset  . Miscarriages / [place] Mother   . Hypertension Mother   . Asthma Brother   . Diabetes Paternal Grandmother     History  Substance Use Topics  . Smoking status: Current Everyday Smoker -- 0.5 packs/day    Types: Cigarettes  . Smokeless tobacco: Not on file  . Alcohol Use: Yes    Allergies: No Known Allergies  No prescriptions prior to admission    Review of Systems  Constitutional: Negative for fever.  Gastrointestinal: Negative for nausea, vomiting and abdominal pain.  Genitourinary: Negative for dysuria, urgency and frequency.       + for vaginal bleeding  Neurological: Negative for headaches.   Physical Exam   Blood pressure 133/80, pulse 74, temperature 98.2 F (36.8 C), temperature source Oral, resp. rate 20, height 5\' 6"  (1.676 m), weight 126.695 kg (279 lb 5 oz), last menstrual period 02/17/2012, SpO2 100.00%.  Physical Exam  Constitutional: She is oriented to person, place, and time. She appears well-developed and well-nourished. No distress.  HENT:  Head: Normocephalic.  Neck: Normal range of motion.  Cardiovascular: Normal rate.   Respiratory: Effort normal.  GI: Soft. She exhibits no distension and no mass. There is no tenderness. There is no rebound and no guarding.  Genitourinary: Uterus is not enlarged (difficult to evaluate due to  body habitus) and not tender. Right adnexum displays no mass, no tenderness and no fullness. Left adnexum displays no mass, no tenderness and no fullness. There is bleeding (small amount of bright red bleeding with 2 small clots) around the vagina.  Neurological: She is alert and oriented to person, place, and time.  Skin: Skin is warm and dry.  Psychiatric: She has a normal mood and affect. Her behavior is normal.   Results for orders placed during the hospital encounter of 03/31/12 (from the past 24 hour(s))  URINALYSIS, ROUTINE W REFLEX MICROSCOPIC     Status: Abnormal   Collection Time   03/31/12  1:14 PM      Component Value Range   Color, Urine YELLOW  YELLOW   APPearance CLEAR  CLEAR   Specific Gravity, Urine >1.030 (*) 1.005 - 1.030   pH 6.0  5.0 - 8.0   Glucose, UA NEGATIVE  NEGATIVE mg/dL   Hgb urine dipstick LARGE (*) NEGATIVE   Bilirubin Urine NEGATIVE  NEGATIVE   Ketones, ur NEGATIVE  NEGATIVE mg/dL   Protein, ur NEGATIVE  NEGATIVE mg/dL   Urobilinogen, UA 0.2  0.0 - 1.0 mg/dL   Nitrite NEGATIVE  NEGATIVE   Leukocytes, UA NEGATIVE  NEGATIVE  URINE MICROSCOPIC-ADD ON     Status: Abnormal   Collection Time   03/31/12  1:14 PM  Component Value Range   Squamous Epithelial / LPF RARE  RARE   WBC, UA 3-6  <3 WBC/hpf   RBC / HPF TOO NUMEROUS TO COUNT  <3 RBC/hpf   Bacteria, UA MANY (*) RARE   Urine-Other MUCOUS PRESENT    HCG, QUANTITATIVE, PREGNANCY     Status: Abnormal   Collection Time   03/31/12  1:37 PM      Component Value Range   hCG, Beta Chain, Quant, S 1158 (*) <5 mIU/mL  ABO/RH     Status: Normal   Collection Time   03/31/12  1:54 PM      Component Value Range   ABO/RH(D) A POS    CBC     Status: Abnormal   Collection Time   03/31/12  1:55 PM      Component Value Range   WBC 4.9  4.0 - 10.5 K/uL   RBC 3.68 (*) 3.87 - 5.11 MIL/uL   Hemoglobin 10.1 (*) 12.0 - 15.0 g/dL   HCT 16.1 (*) 09.6 - 04.5 %   MCV 84.5  78.0 - 100.0 fL   MCH 27.4  26.0 - 34.0 pg     MCHC 32.5  30.0 - 36.0 g/dL   RDW 40.9  81.1 - 91.4 %   Platelets 207  150 - 400 K/uL     Procedures  MDM GC/CHL cultures done last week are negative   Assessment and Plan  A:  Vaginal bleeding in early pregnancy--[redacted]w[redacted]d with non doubling BHCG, low fetal heart rate and GS in lower uterine segment by ultrasound may represent a        Threatened miscarriage    P:  Reviewed lab and ultrasound results with the patient.      Instructed her to return if bleeding became heavier and she had abdominal pain    Begin prenatal care with doctor of her choice.     Pelvic rest until bleeding stops.   Kaitlin Jackson,EVE M 03/31/2012, 1:37 PM

## 2012-03-31 NOTE — Discharge Instructions (Signed)
Vaginal Bleeding During Pregnancy, First Trimester A small amount of bleeding (spotting) is relatively common in early pregnancy. It usually stops on its own. There are many causes for bleeding or spotting in early pregnancy. Some bleeding may be related to the pregnancy and some may not. Cramping with the bleeding is more serious and concerning. Tell your caregiver if you have any vaginal bleeding.  CAUSES   It is normal in most cases.   The pregnancy ends (miscarriage).   The pregnancy may end (threatened miscarriage).   Infection or inflammation of the cervix.   Growths (polyps) on the cervix.   Pregnancy happens outside of the uterus and in a fallopian tube (tubal pregnancy).   Many tiny cysts in the uterus instead of pregnancy tissue (molar pregnancy).  SYMPTOMS  Vaginal bleeding or spotting with or without cramps. DIAGNOSIS  To evaluate the pregnancy, your caregiver may:  Do a pelvic exam.   Take blood tests.   Do an ultrasound.  It is very important to follow your caregiver's instructions.  TREATMENT   Evaluation of the pregnancy with blood tests and ultrasound.   Bed rest (getting up to use the bathroom only).   Rho-gam immunization if the mother is Rh negative and the father is Rh positive.  HOME CARE INSTRUCTIONS   If your caregiver orders bed rest, you may need to make arrangements for the care of other children and for other responsibilities. However, your caregiver may allow you to continue light activity.   Keep track of the number of pads you use each day, how often you change pads and how soaked (saturated) they are. Write this down.   Do not use tampons. Do not douche.   Do not have sexual intercourse or orgasms until approved by your physician.   Save any tissue that you pass for your caregiver to see.   Take medicine for cramps only with your caregiver's permission.   Do not take aspirin because it can make you bleed.  SEEK IMMEDIATE MEDICAL CARE  IF:   You experience severe cramps in your stomach, back or belly (abdomen).   You have an oral temperature above 102 F (38.9 C), not controlled by medicine.   You pass large clots or tissue.   Your bleeding increases or you become light-headed, weak or have fainting episodes.   You develop chills.   You are leaking or have a gush of fluid from your vagina.   You pass out while having a bowel movement. That may mean you have a ruptured tubal pregnancy.  Document Released: 07/14/2005 Document Revised: 09/23/2011 Document Reviewed: 01/23/2009 [facility] Patient Information 2012 [place], [place].Threatened Miscarriage  A threatened miscarriage is a pregnancy that may end. It may be marked by bleeding during the first 20 weeks of pregnancy. Often, the pregnancy can continue without any more problems. You may be asked to stop:  Having sex (intercourse).   Having orgasms.   Using tampons.   Exercising.   Doing heavy physical activity and work.  HOME CARE   Your doctor may tell you to take bed rest and to stop activities and work.   Write down the number of pads you use each day. Write down how often you change pads. Write down how soaked they are.   Follow your doctor's advice for follow-up visits and tests.   If your blood type is Rh-negative and the father's blood is Rh-positive (or is not known), you may get a shot to protect the baby.  If you have a miscarriage, save all the tissue you pass in a container. Take the container to your doctor.  GET HELP RIGHT AWAY IF:   You have bad cramps or pain in your belly (abdomen), lower belly, or back.   You have a fever or chills.   Your bleeding gets worse or you pass large clots of blood or tissue. Save this tissue to show your doctor.   You feel lightheaded, weak, dizzy, or pass out (faint).   You have a gush of fluid from your vagina.  MAKE SURE YOU:   Understand these instructions.   Will watch your condition.   Will  get help right away if you are not doing well or get worse.  Document Released: 09/16/2008 Document Revised: 09/23/2011 Document Reviewed: 10/20/2009 [facility] Patient Information 2012 [place], [place].

## 2012-03-31 NOTE — MAU Note (Signed)
Pt states went to [facility] for abd pain, told she was pregnant, denies pain at present. Started bleeding at 0500 this am, light pinkish/yellow d/c. [place] and felt blood in her panties, in triage noting dark red smear of blood on pad. Last intercourse Monday.

## 2012-04-02 ENCOUNTER — Emergency Department (HOSPITAL_COMMUNITY)
Admission: EM | Admit: 2012-04-02 | Discharge: 2012-04-02 | Disposition: A | Discharge status: Home / Self Care | Payer: Self-pay | Attending: Emergency Medicine | Admitting: Emergency Medicine

## 2012-04-02 ENCOUNTER — Encounter (HOSPITAL_COMMUNITY): Payer: Self-pay | Admitting: Family Medicine

## 2012-04-02 ENCOUNTER — Emergency Department (HOSPITAL_COMMUNITY): Payer: Self-pay

## 2012-04-02 DIAGNOSIS — F17200 Nicotine dependence, unspecified, uncomplicated: Secondary | ICD-10-CM | POA: Insufficient documentation

## 2012-04-02 DIAGNOSIS — O034 Incomplete spontaneous abortion without complication: Secondary | ICD-10-CM | POA: Insufficient documentation

## 2012-04-02 DIAGNOSIS — N289 Disorder of kidney and ureter, unspecified: Secondary | ICD-10-CM | POA: Insufficient documentation

## 2012-04-02 LAB — CBC
HCT: 31 % — ABNORMAL LOW (ref 36.0–46.0)
Hemoglobin: 10.3 g/dL — ABNORMAL LOW (ref 12.0–15.0)
RBC: 3.7 MIL/uL — ABNORMAL LOW (ref 3.87–5.11)
WBC: 5.9 10*3/uL (ref 4.0–10.5)

## 2012-04-02 LAB — WET PREP, GENITAL
Clue Cells Wet Prep HPF POC: NONE SEEN
Trich, Wet Prep: NONE SEEN

## 2012-04-02 LAB — BASIC METABOLIC PANEL
CO2: 22 mEq/L (ref 19–32)
Calcium: 9.2 mg/dL (ref 8.4–10.5)
Creatinine, Ser: 0.74 mg/dL (ref 0.50–1.10)

## 2012-04-02 LAB — HCG, QUANTITATIVE, PREGNANCY: hCG, Beta Chain, Quant, S: 216 m[IU]/mL — ABNORMAL HIGH (ref <5)

## 2012-04-02 LAB — DIFFERENTIAL
Basophils Absolute: 0 10*3/uL (ref 0.0–0.1)
Eosinophils Absolute: 0.1 10*3/uL (ref 0.0–0.7)
Eosinophils Relative: 1 % (ref 0–5)
Lymphocytes Relative: 27 % (ref 12–46)
Monocytes Absolute: 0.3 10*3/uL (ref 0.1–1.0)

## 2012-04-02 MED ORDER — HYDROCODONE-ACETAMINOPHEN 5-325 MG PO TABS: 2.0000 | ORAL_TABLET | ORAL | Status: AC | PRN

## 2012-04-02 NOTE — ED Provider Notes (Signed)
History     CSN: [id number]  Arrival date & time 04/02/12  1478   First MD Initiated Contact with Patient 04/02/12 0902      Chief Complaint  Patient presents with  . Vaginal Bleeding    (Consider location/radiation/quality/duration/timing/severity/associated sxs/prior treatment) HPI Comments: Patient reports that she is currently [redacted] weeks pregnant.  She began having vaginal bleeding and passing blood clots two days ago.  She had an ultrasound performed two days ago at [facility].  She reported that she was told that her pregnancy was viable at that time, but the babies heart was slow.  She states that she was told that she may have a miscarriage.  She reports that the vaginal bleeding increased today.  She has also been having some lower abdominal cramping and vomiting today.  She does not have an OB/GYN.  This is her first pregnancy.    Patient is a 21 y.o. female presenting with vaginal bleeding. The history is provided by the patient.  Vaginal Bleeding Associated symptoms include abdominal pain, nausea and vomiting. Pertinent negatives include no chills, diaphoresis or fever.    Past Medical History  Diagnosis Date  . Obesity   . Renal disorder   . Abnormal Pap smear     Past Surgical History  Procedure Date  . No past surgeries     Family History  Problem Relation Age of Onset  . Miscarriages / [place] Mother   . Hypertension Mother   . Asthma Brother   . Diabetes Paternal Grandmother     History  Substance Use Topics  . Smoking status: Current Everyday Smoker -- 0.5 packs/day    Types: Cigarettes  . Smokeless tobacco: Not on file  . Alcohol Use: Yes    OB History    Grav Para Term Preterm Abortions TAB SAB Ect Mult Living   1               Review of Systems  Constitutional: Negative for fever, chills and diaphoresis.  Respiratory: Negative for shortness of breath.   Gastrointestinal: Positive for nausea, vomiting and abdominal pain. Negative  for diarrhea and blood in stool.  Genitourinary: Positive for vaginal bleeding. Negative for dysuria, urgency, frequency, hematuria, vaginal discharge and difficulty urinating.  Skin: Negative for color change.  Neurological: Negative for dizziness, syncope and light-headedness.    Allergies  Review of patient's allergies indicates no known allergies.  Home Medications   Current Outpatient Rx  Name Route Sig Dispense Refill  . NAPROXEN SODIUM 220 MG PO TABS Oral Take 220 mg by mouth 2 (two) times daily with a meal.    . PRENATAL MULTIVITAMIN CH Oral Take 1 tablet by mouth daily.      BP 123/79  Pulse 65  Temp 98.1 F (36.7 C) (Oral)  Resp 17  SpO2 100%  LMP 02/17/2012  Physical Exam  Nursing note and vitals reviewed. Constitutional: She appears well-developed and well-nourished. No distress.  HENT:  Head: Normocephalic and atraumatic.  Mouth/Throat: Oropharynx is clear and moist.  Cardiovascular: Normal rate, regular rhythm and normal heart sounds.   Pulmonary/Chest: Effort normal and breath sounds normal.  Abdominal: Soft. Bowel sounds are normal. She exhibits no distension and no mass. There is no tenderness. There is no rebound and no guarding.  Genitourinary: There is no rash, tenderness or lesion on the right labia. There is no rash, tenderness or lesion on the left labia. Uterus is tender. Cervix exhibits no motion tenderness. Right adnexum displays no  mass, no tenderness and no fullness. Left adnexum displays tenderness. Left adnexum displays no mass and no fullness. There is bleeding around the vagina.       Bright red blood coming out of the cervical os  Neurological: She is alert.  Skin: Skin is warm and dry. She is not diaphoretic.  Psychiatric: She has a normal mood and affect.    ED Course  Procedures (including critical care time)   Labs Reviewed  CBC  DIFFERENTIAL  BASIC METABOLIC PANEL  HCG, QUANTITATIVE, PREGNANCY  GC/CHLAMYDIA PROBE AMP, GENITAL    WET PREP, GENITAL   US Ob Comp Less 14 Wks  04/02/2012  *RADIOLOGY REPORT*  Clinical Data: Vaginal bleeding, pregnant  OBSTETRIC <14 WK [place] AND TRANSVAGINAL OB US  Technique:  Both transabdominal and transvaginal ultrasound examinations were performed for complete evaluation of the gestation as well as the maternal uterus, adnexal regions, and pelvic cul-de-sac.  Transvaginal technique was performed to assess early pregnancy.  Comparison:  03/31/2012 at [facility]  Intrauterine gestational sac:  Not visualized Yolk sac: Not visualized Embryo: Not visualized Cardiac Activity: Not visualized  Endometrial stripe is heterogeneously thickened measuring 1.5 cm with debris in the lower uterine segment endometrial canal.  Maternal uterus/adnexae: The ovaries are normal.  Small free fluid noted.  IMPRESSION: Apparent interval missed abortion, intrauterine gestational sac no longer visualized with blood product/debris in the endometrial canal.  Original Report Authenticated By: Harrel Lemon, M.D.   US Ob Transvaginal  04/02/2012  *RADIOLOGY REPORT*  Clinical Data: Vaginal bleeding, pregnant  OBSTETRIC <14 WK [place] AND TRANSVAGINAL OB US  Technique:  Both transabdominal and transvaginal ultrasound examinations were performed for complete evaluation of the gestation as well as the maternal uterus, adnexal regions, and pelvic cul-de-sac.  Transvaginal technique was performed to assess early pregnancy.  Comparison:  03/31/2012 at [facility]  Intrauterine gestational sac:  Not visualized Yolk sac: Not visualized Embryo: Not visualized Cardiac Activity: Not visualized  Endometrial stripe is heterogeneously thickened measuring 1.5 cm with debris in the lower uterine segment endometrial canal.  Maternal uterus/adnexae: The ovaries are normal.  Small free fluid noted.  IMPRESSION: Apparent interval missed abortion, intrauterine gestational sac no longer visualized with blood product/debris in the  endometrial canal.  Original Report Authenticated By: Harrel Lemon, M.D.     1. Incomplete abortion       MDM  Patient reports that she is [redacted] weeks pregnant.  She comes in today with concern that she may have had a spontaneous abortion.  She reports that she has had vaginal bleeding over the past 2 days.  She was seen at [facility] 2 days ago and at that time the pregnancy was viable.  She returns today with increased vaginal bleeding and lower abdominal cramping.   Ultrasound today reveals a missed abortion and the intrauterine gestational sac is no longer visualized.  Patient informed of the results and instructed to follow up with Gynecology or return to [facility] hospital.  Return precautions discussed with patient.        Pascal Lux [place], PA-C 04/02/12 1931

## 2012-04-02 NOTE — ED Notes (Signed)
Pt sts passing clots and possible miscarriage. [facility]. sts was told "babies heart beat was low". Pt having lower abdominal cramping.

## 2012-04-03 NOTE — ED Provider Notes (Signed)
Medical screening examination/treatment/procedure(s) were performed by non-physician practitioner and as supervising physician I was immediately available for consultation/collaboration.   Gavin Pound. Brianny Soulliere, MD 04/03/12 1536

## 2012-09-19 ENCOUNTER — Inpatient Hospital Stay (HOSPITAL_COMMUNITY)
Admission: AD | Admit: 2012-09-19 | Discharge: 2012-09-19 | Disposition: A | Discharge status: Home / Self Care | Payer: Self-pay | Source: Ambulatory Visit | Attending: Obstetrics & Gynecology | Admitting: Obstetrics & Gynecology

## 2012-09-19 ENCOUNTER — Encounter (HOSPITAL_COMMUNITY): Payer: Self-pay | Admitting: *Deleted

## 2012-09-19 DIAGNOSIS — R109 Unspecified abdominal pain: Secondary | ICD-10-CM | POA: Insufficient documentation

## 2012-09-19 DIAGNOSIS — Z202 Contact with and (suspected) exposure to infections with a predominantly sexual mode of transmission: Secondary | ICD-10-CM | POA: Insufficient documentation

## 2012-09-19 DIAGNOSIS — Z711 Person with feared health complaint in whom no diagnosis is made: Secondary | ICD-10-CM

## 2012-09-19 LAB — URINALYSIS, ROUTINE W REFLEX MICROSCOPIC
Glucose, UA: NEGATIVE mg/dL
Protein, ur: 30 mg/dL — AB
Urobilinogen, UA: 0.2 mg/dL (ref 0.0–1.0)

## 2012-09-19 LAB — URINE MICROSCOPIC-ADD ON

## 2012-09-19 LAB — POCT PREGNANCY, URINE: Preg Test, Ur: NEGATIVE

## 2012-09-19 NOTE — MAU Provider Note (Signed)
CC: Abdominal Pain    First Provider Initiated Contact with Patient 09/19/12 1338      HPI Koralynn L Sellin is a 21 y.o. G1P001 who is using no birth control and requesting STD check. Has had sex with a partner who has been sexually active with a lot of females concurrently. LMP 08/18/12. Denies dysuria, urgency, frequency, hematuria.  Past Medical History  Diagnosis Date  . Obesity   . Renal disorder   . Abnormal Pap smear     OB History    Grav Para Term Preterm Abortions TAB SAB Ect Mult Living   1    1  1         # Outc Date GA Lbr Len/2nd Wgt Sex Del Anes PTL Lv   1 SAB               Past Surgical History  Procedure Date  . No past surgeries     History   Social History  . Marital Status: Single    Spouse Name: N/A    Number of Children: N/A  . Years of Education: N/A   Occupational History  . Not on file.   Social History Main Topics  . Smoking status: Former Smoker -- 0.5 packs/day    Types: Cigarettes  . Smokeless tobacco: Not on file  . Alcohol Use: Yes  . Drug Use: No  . Sexually Active: Yes    Birth Control/ Protection: None   Other Topics Concern  . Not on file   Social History Narrative  . No narrative on file    No current facility-administered medications on file prior to encounter.   No current outpatient prescriptions on file prior to encounter.    No Known Allergies  ROS Pertinent items in HPI  PHYSICAL EXAM Filed Vitals:   09/19/12 1144  BP: 138/84  Pulse: 70  Temp: 97.9 F (36.6 C)  Resp: 20   General: Well nourished, well developed obese female in no acute distress Cardiovascular: Normal rate Respiratory: Normal effort Abdomen: Soft, nontender Back: No CVAT Extremities: No edema Neurologic: Alert and oriented Speculum exam: NEFG; vagina with physiologic discharge, no blood; cervix clean Bimanual exam: cervix closed, no CMT; uterus NSSP; no adnexal tenderness or masses  LAB RESULTS Results for orders placed  during the hospital encounter of 09/19/12 (from the past 24 hour(s))  URINALYSIS, ROUTINE W REFLEX MICROSCOPIC     Status: Abnormal   Collection Time   09/19/12 12:18 PM      Component Value Range   Color, Urine YELLOW  YELLOW   APPearance HAZY (*) CLEAR   Specific Gravity, Urine >1.030 (*) 1.005 - 1.030   pH 6.0  5.0 - 8.0   Glucose, UA NEGATIVE  NEGATIVE mg/dL   Hgb urine dipstick TRACE (*) NEGATIVE   Bilirubin Urine NEGATIVE  NEGATIVE   Ketones, ur NEGATIVE  NEGATIVE mg/dL   Protein, ur 30 (*) NEGATIVE mg/dL   Urobilinogen, UA 0.2  0.0 - 1.0 mg/dL   Nitrite NEGATIVE  NEGATIVE   Leukocytes, UA TRACE (*) NEGATIVE  URINE MICROSCOPIC-ADD ON     Status: Abnormal   Collection Time   09/19/12 12:18 PM      Component Value Range   Squamous Epithelial / LPF MANY (*) RARE   WBC, UA 11-20  <3 WBC/hpf   RBC / HPF 7-10  <3 RBC/hpf   Bacteria, UA MANY (*) RARE   Urine-Other MUCOUS PRESENT    POCT PREGNANCY, URINE  Status: Normal   Collection Time   09/19/12 12:49 PM      Component Value Range   Preg Test, Ur NEGATIVE  NEGATIVE  WET PREP, GENITAL     Status: Abnormal   Collection Time   09/19/12  2:00 PM      Component Value Range   Yeast Wet Prep HPF POC NONE SEEN  NONE SEEN   Trich, Wet Prep NONE SEEN  NONE SEEN   Clue Cells Wet Prep HPF POC NONE SEEN  NONE SEEN   WBC, Wet Prep HPF POC MODERATE (*) NONE SEEN    IMAGING No results found.  MAU COURSE GC/CT sent. Urine culture will be sent per lab protocol ASSESSMENT  1. Concern about STD in female without diagnosis     PLAN Discharge home. See AVS for patient education. Referred to [facility] for full evaluation for STIs and Family Planning. Follow-up Information    Schedule an appointment as soon as possible for a visit with [facility].   Contact information:   [address] [place] [address] [phone]          Medication List     As of 09/19/2012  2:20 PM     TAKE these medications         acetaminophen 500 MG tablet   Commonly known as: TYLENOL   Take 1,000 mg by mouth daily as needed. Headaches/cramps           Danae Orleans, CNM 09/19/2012 1:39 PM

## 2012-09-19 NOTE — MAU Provider Note (Signed)
Attestation of Attending Supervision of Advanced Practitioner (CNM/NP): Evaluation and management procedures were performed by the Advanced Practitioner under my supervision and collaboration.  I have reviewed the Advanced Practitioner's note and chart, and I agree with the management and plan.  HARRAWAY-SMITH, Muranda Coye 8:51 PM     

## 2012-09-19 NOTE — MAU Note (Signed)
Has been having unprotected sex since miscarriage.  Has been having cramps and some nausea.  Thinks boyfriend gave her something.

## 2012-09-20 LAB — URINE CULTURE: Colony Count: 30000

## 2013-11-15 ENCOUNTER — Emergency Department (HOSPITAL_COMMUNITY)
Admission: EM | Admit: 2013-11-15 | Discharge: 2013-11-15 | Disposition: A | Discharge status: Home / Self Care | Payer: Self-pay | Attending: Emergency Medicine | Admitting: Emergency Medicine

## 2013-11-15 ENCOUNTER — Encounter (HOSPITAL_COMMUNITY): Payer: Self-pay | Admitting: Emergency Medicine

## 2013-11-15 DIAGNOSIS — J029 Acute pharyngitis, unspecified: Secondary | ICD-10-CM | POA: Insufficient documentation

## 2013-11-15 DIAGNOSIS — Z87448 Personal history of other diseases of urinary system: Secondary | ICD-10-CM | POA: Insufficient documentation

## 2013-11-15 DIAGNOSIS — E669 Obesity, unspecified: Secondary | ICD-10-CM | POA: Insufficient documentation

## 2013-11-15 DIAGNOSIS — Z87891 Personal history of nicotine dependence: Secondary | ICD-10-CM | POA: Insufficient documentation

## 2013-11-15 LAB — RAPID STREP SCREEN (MED CTR MEBANE ONLY): Streptococcus, Group A Screen (Direct): NEGATIVE

## 2013-11-15 MED ORDER — IBUPROFEN 800 MG PO TABS: 800.0000 mg | ORAL_TABLET | Freq: Three times a day (TID) | ORAL | Status: DC

## 2013-11-15 MED ORDER — DEXAMETHASONE 2 MG PO TABS
10.0000 mg | ORAL_TABLET | Freq: Once | ORAL | Status: AC
  Administered 2013-11-15: 10 mg via ORAL
  Filled 2013-11-15: qty 5

## 2013-11-15 MED ORDER — MAGIC MOUTHWASH
5.0000 mL | Freq: Once | ORAL | Status: DC
Start: 2013-11-15 — End: 2013-11-15
  Filled 2013-11-15: qty 5

## 2013-11-15 MED ORDER — DEXAMETHASONE 1 MG/ML PO CONC: 10.0000 mg | Freq: Once | ORAL | Status: DC

## 2013-11-15 MED ORDER — MAGIC MOUTHWASH W/LIDOCAINE: 5.0000 mL | Freq: Four times a day (QID) | ORAL | Status: DC | PRN

## 2013-11-15 MED ORDER — MAGIC MOUTHWASH
15.0000 mL | Freq: Once | ORAL | Status: AC
  Administered 2013-11-15: 15 mL via ORAL

## 2013-11-15 NOTE — ED Provider Notes (Signed)
Medical screening examination/treatment/procedure(s) were performed by non-physician practitioner and as supervising physician I was immediately available for consultation/collaboration.  EKG Interpretation   None         Charles B. Sheldon, MD 11/15/13 1327 

## 2013-11-15 NOTE — ED Provider Notes (Signed)
CSN: [id number]     Arrival date & time 11/15/13  4098 History   First MD Initiated Contact with Patient 11/15/13 0956    This chart was scribed for Francee Piccolo PA-C, a non-physician practitioner working with No att. providers found by Lewanda Rife, ED Scribe. This patient was seen in room TR08C/TR08C and the patient's care was started at 10:10 AM     Chief Complaint  Patient presents with  . Sore Throat   (Consider location/radiation/quality/duration/timing/severity/associated sxs/prior Treatment) The history is provided by the patient. No language interpreter was used.   HPI Comments: LATIKA KRONICK is a 23 y.o. female who presents to the Emergency Department complaining of constant worsening sore throat onset 3 days. Describes sore throat as moderate in severity. Reports pain is exacerbated by swallowing, but is able to tolerate soft foods. Reports trying cough drops with no relief of symptoms. No meds PTA. Denies associated fever, cough, emesis, dysphagia, and difficulty breathing.  Past Medical History  Diagnosis Date  . Obesity   . Renal disorder   . Abnormal Pap smear    Past Surgical History  Procedure Laterality Date  . No past surgeries     Family History  Problem Relation Age of Onset  . Miscarriages / [place] Mother   . Hypertension Mother   . Asthma Brother   . Diabetes Paternal Grandmother    History  Substance Use Topics  . Smoking status: Former Smoker -- 0.50 packs/day    Types: Cigarettes  . Smokeless tobacco: Not on file  . Alcohol Use: Yes   OB History   Grav Para Term Preterm Abortions TAB SAB Ect Mult Living   1    1  1         Review of Systems  Constitutional: Negative for fever.  HENT: Positive for sore throat. Negative for trouble swallowing.   Respiratory: Negative for cough and shortness of breath.   Gastrointestinal: Negative for vomiting.  Psychiatric/Behavioral: Negative for confusion.    Allergies  Review of  patient's allergies indicates no known allergies.  Home Medications   Current Outpatient Rx  Name  Route  Sig  Dispense  Refill  . Alum & Mag Hydroxide-Simeth (MAGIC MOUTHWASH W/LIDOCAINE) SOLN   Oral   Take 5 mLs by mouth 4 (four) times daily as needed for mouth pain.   45 mL   0   . ibuprofen (ADVIL,MOTRIN) 800 MG tablet   Oral   Take 1 tablet (800 mg total) by mouth 3 (three) times daily.   21 tablet   0    BP 139/87  Pulse 67  Temp(Src) 98.5 F (36.9 C) (Oral)  Resp 18  Ht 5\' 6"  (1.676 m)  Wt 301 lb (136.533 kg)  BMI 48.61 kg/m2  SpO2 100% Physical Exam  Constitutional: She is oriented to person, place, and time. She appears well-developed and well-nourished. No distress.  HENT:  Head: Normocephalic and atraumatic.  Right Ear: External ear normal.  Left Ear: External ear normal.  Nose: Nose normal.  Mouth/Throat: Uvula is midline and mucous membranes are normal. No trismus in the jaw. No uvula swelling. Oropharyngeal exudate and posterior oropharyngeal erythema present. No posterior oropharyngeal edema or tonsillar abscesses.  Eyes: Conjunctivae are normal.  Neck: Normal range of motion. Neck supple.  Cardiovascular: Normal rate and regular rhythm.   Pulmonary/Chest: Effort normal and breath sounds normal.  Abdominal: Soft.  Musculoskeletal: Normal range of motion.  Lymphadenopathy:    She has cervical adenopathy.  Neurological:  She is alert and oriented to person, place, and time.  Skin: Skin is warm and dry. She is not diaphoretic.  Psychiatric: She has a normal mood and affect.    ED Course  Procedures (including critical care time) Medications  dexamethasone (DECADRON) tablet 10 mg (10 mg Oral Given 11/15/13 1052)  magic mouthwash (15 mLs Oral Given 11/15/13 1054)    COORDINATION OF CARE:  Nursing notes reviewed. Vital signs reviewed. Initial pt interview and examination performed.   10:13 AM-Discussed work up plan with pt at bedside, which includes  rapid strep screen. Pt agrees with plan.   Treatment plan initiated: Medications  dexamethasone (DECADRON) tablet 10 mg (10 mg Oral Given 11/15/13 1052)  magic mouthwash (15 mLs Oral Given 11/15/13 1054)     Initial diagnostic testing ordered.    Labs Review Labs Reviewed  RAPID STREP SCREEN  CULTURE, GROUP A STREP   Imaging Review No results found.  EKG Interpretation   None       MDM   1. Viral pharyngitis    Filed Vitals:   11/15/13 0953  BP: 139/87  Pulse: 67  Temp: 98.5 F (36.9 C)  Resp: 18    Pt afebrile with tonsillar exudate, negative strep. Presents with mild cervical lymphadenopathy, & dysphagia; diagnosis of viral pharyngitis. No abx indicated. DC w symptomatic tx for pain  Pt does not appear dehydrated, but did discuss importance of water rehydration. Presentation non concerning for PTA or infxn spread to soft tissue. No trismus or uvula deviation. Specific return precautions discussed. Pt able to drink water in ED without difficulty with intact air way. Recommended PCP follow up.   I personally performed the services described in this documentation, which was scribed in my presence. The recorded information has been reviewed and is accurate.     Jeannetta EllisJennifer L Aquarius Latouche, PA-C 11/15/13 1131

## 2013-11-15 NOTE — Discharge Instructions (Signed)
Please follow up with your primary care physician in 1-2 days. If you do not have one please call the [facility] number listed above. Please take medication as prescribed. Please read all discharge instructions and return precautions.   Viral Pharyngitis Viral pharyngitis is a viral infection that produces redness, pain, and swelling (inflammation) of the throat. It can spread from person to person (contagious). CAUSES Viral pharyngitis is caused by inhaling a large amount of certain germs called viruses. Many different viruses cause viral pharyngitis. SYMPTOMS Symptoms of viral pharyngitis include:  Sore throat.  Tiredness.  Stuffy nose.  Low-grade fever.  Congestion.  Cough. TREATMENT Treatment includes rest, drinking plenty of fluids, and the use of over-the-counter medication (approved by your caregiver). HOME CARE INSTRUCTIONS   Drink enough fluids to keep your urine clear or pale yellow.  Eat soft, cold foods such as ice cream, frozen ice pops, or gelatin dessert.  Gargle with warm salt water (1 tsp salt per 1 qt of water).  If over age 667, throat lozenges may be used safely.  Only take over-the-counter or prescription medicines for pain, discomfort, or fever as directed by your caregiver. Do not take aspirin. To help prevent spreading viral pharyngitis to others, avoid:  Mouth-to-mouth contact with others.  Sharing utensils for eating and drinking.  Coughing around others. SEEK MEDICAL CARE IF:   You are better in a few days, then become worse.  You have a fever or pain not helped by pain medicines.  There are any other changes that concern you. Document Released: 07/14/2005 Document Revised: 12/27/2011 Document Reviewed: 12/10/2010 [place] [employer] Patient Information 2014 [employer] [place].

## 2013-11-15 NOTE — ED Notes (Signed)
Pt here from home with c/o sore throat , pt tonsils are enlarged and throat is slightly red in color

## 2013-11-17 LAB — CULTURE, GROUP A STREP

## 2014-03-08 ENCOUNTER — Encounter (HOSPITAL_COMMUNITY): Payer: Self-pay | Admitting: Emergency Medicine

## 2014-03-08 ENCOUNTER — Emergency Department (HOSPITAL_COMMUNITY)
Admission: EM | Admit: 2014-03-08 | Discharge: 2014-03-08 | Disposition: A | Discharge status: Home / Self Care | Payer: Self-pay | Attending: Emergency Medicine | Admitting: Emergency Medicine

## 2014-03-08 DIAGNOSIS — Z87891 Personal history of nicotine dependence: Secondary | ICD-10-CM | POA: Insufficient documentation

## 2014-03-08 DIAGNOSIS — B3731 Acute candidiasis of vulva and vagina: Secondary | ICD-10-CM | POA: Insufficient documentation

## 2014-03-08 DIAGNOSIS — B373 Candidiasis of vulva and vagina: Secondary | ICD-10-CM

## 2014-03-08 DIAGNOSIS — N289 Disorder of kidney and ureter, unspecified: Secondary | ICD-10-CM | POA: Insufficient documentation

## 2014-03-08 DIAGNOSIS — N938 Other specified abnormal uterine and vaginal bleeding: Secondary | ICD-10-CM

## 2014-03-08 DIAGNOSIS — E669 Obesity, unspecified: Secondary | ICD-10-CM | POA: Insufficient documentation

## 2014-03-08 DIAGNOSIS — Z87442 Personal history of urinary calculi: Secondary | ICD-10-CM | POA: Insufficient documentation

## 2014-03-08 DIAGNOSIS — R109 Unspecified abdominal pain: Secondary | ICD-10-CM | POA: Insufficient documentation

## 2014-03-08 HISTORY — DX: Calculus of kidney: N20.0

## 2014-03-08 LAB — URINALYSIS, ROUTINE W REFLEX MICROSCOPIC
Bilirubin Urine: NEGATIVE
GLUCOSE, UA: NEGATIVE mg/dL
HGB URINE DIPSTICK: NEGATIVE
Ketones, ur: NEGATIVE mg/dL
LEUKOCYTES UA: NEGATIVE
Nitrite: NEGATIVE
PH: 6 (ref 5.0–8.0)
PROTEIN: NEGATIVE mg/dL
SPECIFIC GRAVITY, URINE: 1.035 — AB (ref 1.005–1.030)
Urobilinogen, UA: 1 mg/dL (ref 0.0–1.0)

## 2014-03-08 LAB — POC URINE PREG, ED: PREG TEST UR: NEGATIVE

## 2014-03-08 LAB — WET PREP, GENITAL
Clue Cells Wet Prep HPF POC: NONE SEEN
TRICH WET PREP: NONE SEEN

## 2014-03-08 NOTE — ED Provider Notes (Addendum)
CSN: [id number]     Arrival date & time 03/08/14  0848 History   First MD Initiated Contact with Patient 03/08/14 [phone]     Chief Complaint  Patient presents with  . Vaginal Bleeding     (Consider location/radiation/quality/duration/timing/severity/associated sxs/prior Treatment) Patient is a 23 y.o. female presenting with vaginal bleeding. The history is provided by the patient.  Vaginal Bleeding Quality:  Clots and lighter than menses Severity:  Mild Onset quality:  Sudden Duration:  1 day Timing:  Rare Progression:  Resolved Chronicity:  Recurrent Menstrual history:  Irregular Number of pads used:  2 Possible pregnancy: no   Context: spontaneously   Relieved by:  None tried Worsened by:  Nothing tried Associated symptoms: abdominal pain and vaginal discharge   Associated symptoms: no dizziness, no dysuria and no nausea   Associated symptoms comment:   tues had a brown discharge and then yesterday passed some clots and had lower abd pain. Risk factors: prior miscarriage and unprotected sex   Risk factors: does not have multiple partners and no STD exposure     Past Medical History  Diagnosis Date  . Obesity   . Renal disorder   . Abnormal Pap smear   . Kidney stones    Past Surgical History  Procedure Laterality Date  . No past surgeries     Family History  Problem Relation Age of Onset  . Miscarriages / [place] Mother   . Hypertension Mother   . Asthma Brother   . Diabetes Paternal Grandmother    History  Substance Use Topics  . Smoking status: Former Smoker -- 0.50 packs/day    Types: Cigarettes  . Smokeless tobacco: Not on file  . Alcohol Use: No   OB History   Grav Para Term Preterm Abortions TAB SAB Ect Mult Living   1    1  1         Review of Systems  Gastrointestinal: Positive for abdominal pain. Negative for nausea.  Genitourinary: Positive for vaginal bleeding and vaginal discharge. Negative for dysuria.  Neurological: Negative for  dizziness.  All other systems reviewed and are negative.     Allergies  Review of patient's allergies indicates no known allergies.  Home Medications   Prior to Admission medications   Medication Sig Start Date End Date Taking? Authorizing Provider  ibuprofen (ADVIL,MOTRIN) 200 MG tablet Take 400 mg by mouth every 6 (six) hours as needed for fever, headache or mild pain.   Yes Historical Provider, MD   BP 137/74  Pulse 58  Temp(Src) 98 F (36.7 C) (Oral)  Resp 16  Ht 5\' 5"  (1.651 m)  SpO2 100%  LMP 02/23/2014 Physical Exam  Nursing note and vitals reviewed. Constitutional: She is oriented to person, place, and time. She appears well-developed and well-nourished. No distress.  HENT:  Head: Normocephalic and atraumatic.  Mouth/Throat: Oropharynx is clear and moist.  Eyes: Conjunctivae and EOM are normal. Pupils are equal, round, and reactive to light.  Neck: Normal range of motion. Neck supple.  Cardiovascular: Normal rate, regular rhythm and intact distal pulses.   No murmur heard. Pulmonary/Chest: Effort normal and breath sounds normal. No respiratory distress. She has no wheezes. She has no rales.  Abdominal: Soft. She exhibits no distension. There is no tenderness. There is no rebound and no guarding.  Genitourinary: Vagina normal and uterus normal. There is no rash or tenderness on the right labia. There is no rash or tenderness on the left labia. Cervix exhibits no motion  tenderness and no discharge. Right adnexum displays no mass and no tenderness. Left adnexum displays no mass and no tenderness. No tenderness or bleeding around the vagina. No vaginal discharge found.  Musculoskeletal: Normal range of motion. She exhibits no edema and no tenderness.  Neurological: She is alert and oriented to person, place, and time.  Skin: Skin is warm and dry. No rash noted. No erythema.  Psychiatric: She has a normal mood and affect. Her behavior is normal.    ED Course  Procedures  (including critical care time) Labs Review Labs Reviewed  WET PREP, GENITAL - Abnormal; Notable for the following:    Yeast Wet Prep HPF POC TOO NUMEROUS TO COUNT (*)    WBC, Wet Prep HPF POC FEW (*)    All other components within normal limits  URINALYSIS, ROUTINE W REFLEX MICROSCOPIC - Abnormal; Notable for the following:    APPearance CLOUDY (*)    Specific Gravity, Urine 1.035 (*)    All other components within normal limits  GC/CHLAMYDIA PROBE AMP  POC URINE PREG, ED    Imaging Review No results found.   EKG Interpretation None      MDM   Final diagnoses:  Vaginal candidiasis  DUB (dysfunctional uterine bleeding)    Pt here for sx most suggestive of DUB.  Passed some clots last night and some brown discharge on tues. last menses last week.  No prior hx of STD but currently sexually active without protection.  UA and UPT pending.  Will do pelvic exam and send GC/Chlamydia, wet prep.  Normal pelvic exam  10:31 AM Wet prep with yeast but o/w normal.  Will use monistat.  UPT neg.  Gwyneth SproutWhitney Claris Pech, MD 03/08/14 1032  Gwyneth SproutWhitney Kaprice Kage, MD 03/08/14 1036

## 2014-03-08 NOTE — ED Notes (Signed)
Dr. Plunkett at the bedside.  

## 2014-03-08 NOTE — ED Notes (Signed)
Pt states she had her period last week and Tuesday started having brown discharge after it ended. Last night passed some clots and started having lower abd pain.

## 2014-03-08 NOTE — Discharge Instructions (Signed)
Abnormal Uterine Bleeding Abnormal uterine bleeding can affect women at various stages in life, including teenagers, women in their reproductive years, pregnant women, and women who have reached menopause. Several kinds of uterine bleeding are considered abnormal, including:  Bleeding or spotting between periods.   Bleeding after sexual intercourse.   Bleeding that is heavier or more than normal.   Periods that last longer than usual.  Bleeding after menopause.  Many cases of abnormal uterine bleeding are minor and simple to treat, while others are more serious. Any type of abnormal bleeding should be evaluated by your health care provider. Treatment will depend on the cause of the bleeding. HOME CARE INSTRUCTIONS Monitor your condition for any changes. The following actions may help to alleviate any discomfort you are experiencing:  Avoid the use of tampons and douches as directed by your health care provider.  Change your pads frequently. You should get regular pelvic exams and Pap tests. Keep all follow-up appointments for diagnostic tests as directed by your health care provider.  SEEK MEDICAL CARE IF:   Your bleeding lasts more than 1 week.   You feel dizzy at times.  SEEK IMMEDIATE MEDICAL CARE IF:   You pass out.   You are changing pads every 15 to 30 minutes.   You have abdominal pain.  You have a fever.   You become sweaty or weak.   You are passing large blood clots from the vagina.   You start to feel nauseous and vomit. MAKE SURE YOU:   Understand these instructions.  Will watch your condition.  Will get help right away if you are not doing well or get worse. Document Released: 10/04/2005 Document Revised: 06/06/2013 Document Reviewed: 05/03/2013 [facility] Patient Information 2014 [place], [place].  Candida Infection, Adult A candida infection (also called yeast, fungus and Monilia infection) is an overgrowth of yeast that can occur anywhere on  the body. A yeast infection commonly occurs in warm, moist body areas. Usually, the infection remains localized but can spread to become a systemic infection. A yeast infection may be a sign of a more severe disease such as diabetes, leukemia, or AIDS. A yeast infection can occur in both men and women. In women, Candida vaginitis is a vaginal infection. It is one of the most common causes of vaginitis. Men usually do not have symptoms or know they have an infection until other problems develop. Men may find out they have a yeast infection because their sex partner has a yeast infection. Uncircumcised men are more likely to get a yeast infection than circumcised men. This is because the uncircumcised glans is not exposed to air and does not remain as dry as that of a circumcised glans. Older adults may develop yeast infections around dentures. CAUSES  Women  Antibiotics.  Steroid medication taken for a long time.  Being overweight (obese).  Diabetes.  Poor immune condition.  Certain serious medical conditions.  Immune suppressive medications for organ transplant patients.  Chemotherapy.  Pregnancy.  Menstration.  Stress and fatigue.  Intravenous drug use.  Oral contraceptives.  Wearing tight-fitting clothes in the crotch area.  Catching it from a sex partner who has a yeast infection.  Spermicide.  Intravenous, urinary, or other catheters. Men  Catching it from a sex partner who has a yeast infection.  Having oral or anal sex with a person who has the infection.  Spermicide.  Diabetes.  Antibiotics.  Poor immune system.  Medications that suppress the immune system.  Intravenous drug  use.  Intravenous, urinary, or other catheters. SYMPTOMS  Women  Thick, white vaginal discharge.  Vaginal itching.  Redness and swelling in and around the vagina.  Irritation of the lips of the vagina and perineum.  Blisters on the vaginal lips and perineum.  Painful  sexual intercourse.  Low blood sugar (hypoglycemia).  Painful urination.  Bladder infections.  Intestinal problems such as constipation, indigestion, bad breath, bloating, increase in gas, diarrhea, or loose stools. Men  Men may develop intestinal problems such as constipation, indigestion, bad breath, bloating, increase in gas, diarrhea, or loose stools.  Dry, cracked skin on the penis with itching or discomfort.  Jock itch.  Dry, flaky skin.  Athlete's foot.  Hypoglycemia. DIAGNOSIS  Women  A history and an exam are performed.  The discharge may be examined under a microscope.  A culture may be taken of the discharge. Men  A history and an exam are performed.  Any discharge from the penis or areas of cracked skin will be looked at under the microscope and cultured.  Stool samples may be cultured. TREATMENT  Women  Vaginal antifungal suppositories and creams.  Medicated creams to decrease irritation and itching on the outside of the vagina.  Warm compresses to the perineal area to decrease swelling and discomfort.  Oral antifungal medications.  Medicated vaginal suppositories or cream for repeated or recurrent infections.  Wash and dry the irritation areas before applying the cream.  Eating yogurt with lactobacillus may help with prevention and treatment.  Sometimes painting the vagina with gentian violet solution may help if creams and suppositories do not work. Men  Antifungal creams and oral antifungal medications.  Sometimes treatment must continue for 30 days after the symptoms go away to prevent recurrence. HOME CARE INSTRUCTIONS  Women  Use cotton underwear and avoid tight-fitting clothing.  Avoid colored, scented toilet paper and deodorant tampons or pads.  Do not douche.  Keep your diabetes under control.  Finish all the prescribed medications.  Keep your skin clean and dry.  Consume milk or yogurt with lactobacillus active culture  regularly. If you get frequent yeast infections and think that is what the infection is, there are over-the-counter medications that you can get. If the infection does not show healing in 3 days, talk to your caregiver.  Tell your sex partner you have a yeast infection. Your partner may need treatment also, especially if your infection does not clear up or recurs. Men  Keep your skin clean and dry.  Keep your diabetes under control.  Finish all prescribed medications.  Tell your sex partner that you have a yeast infection so they can be treated if necessary. SEEK MEDICAL CARE IF:   Your symptoms do not clear up or worsen in one week after treatment.  You have an oral temperature above 102 F (38.9 C).  You have trouble swallowing or eating for a prolonged time.  You develop blisters on and around your vagina.  You develop vaginal bleeding and it is not your menstrual period.  You develop abdominal pain.  You develop intestinal problems as mentioned above.  You get weak or lightheaded.  You have painful or increased urination.  You have pain during sexual intercourse. MAKE SURE YOU:   Understand these instructions.  Will watch your condition.  Will get help right away if you are not doing well or get worse. Document Released: 11/11/2004 Document Revised: 12/27/2011 Document Reviewed: 02/23/2010 [facility] Patient Information 2014 [place], [place].

## 2014-03-09 LAB — GC/CHLAMYDIA PROBE AMP
CT PROBE, AMP APTIMA: NEGATIVE
GC Probe RNA: NEGATIVE

## 2014-08-19 ENCOUNTER — Encounter (HOSPITAL_COMMUNITY): Payer: Self-pay | Admitting: Emergency Medicine

## 2016-06-17 ENCOUNTER — Encounter (HOSPITAL_COMMUNITY): Payer: Self-pay | Admitting: Emergency Medicine

## 2016-06-17 ENCOUNTER — Ambulatory Visit (HOSPITAL_COMMUNITY)
Admission: EM | Admit: 2016-06-17 | Discharge: 2016-06-17 | Disposition: A | Discharge status: Home / Self Care | Payer: 59 | Attending: Family Medicine | Admitting: Family Medicine

## 2016-06-17 DIAGNOSIS — Z87891 Personal history of nicotine dependence: Secondary | ICD-10-CM | POA: Insufficient documentation

## 2016-06-17 DIAGNOSIS — E669 Obesity, unspecified: Secondary | ICD-10-CM | POA: Diagnosis not present

## 2016-06-17 DIAGNOSIS — Z8249 Family history of ischemic heart disease and other diseases of the circulatory system: Secondary | ICD-10-CM | POA: Diagnosis not present

## 2016-06-17 DIAGNOSIS — J029 Acute pharyngitis, unspecified: Secondary | ICD-10-CM | POA: Diagnosis present

## 2016-06-17 DIAGNOSIS — Z833 Family history of diabetes mellitus: Secondary | ICD-10-CM | POA: Insufficient documentation

## 2016-06-17 DIAGNOSIS — J020 Streptococcal pharyngitis: Secondary | ICD-10-CM | POA: Insufficient documentation

## 2016-06-17 DIAGNOSIS — Z825 Family history of asthma and other chronic lower respiratory diseases: Secondary | ICD-10-CM | POA: Insufficient documentation

## 2016-06-17 LAB — POCT RAPID STREP A: Streptococcus, Group A Screen (Direct): NEGATIVE

## 2016-06-17 MED ORDER — LIDOCAINE VISCOUS 2 % MT SOLN: 20.0000 mL | OROMUCOSAL | 0 refills | Status: DC | PRN

## 2016-06-17 MED ORDER — FLUCONAZOLE 150 MG PO TABS: 150.0000 mg | ORAL_TABLET | Freq: Every day | ORAL | 0 refills | Status: DC

## 2016-06-17 MED ORDER — AMOXICILLIN 875 MG PO TABS: 875.0000 mg | ORAL_TABLET | Freq: Two times a day (BID) | ORAL | 0 refills | Status: DC

## 2016-06-17 NOTE — ED Provider Notes (Signed)
CSN: [id number]     Arrival date & time 06/17/16  1147 History   None    Chief Complaint  Patient presents with  . Sore Throat   (Consider location/radiation/quality/duration/timing/severity/associated sxs/prior Treatment) Patient c/o sore throat for 2 days.   The history is provided by the patient.  Sore Throat  This is a new problem. The current episode started 2 days ago. The problem occurs constantly. Nothing aggravates the symptoms. Nothing relieves the symptoms. She has tried nothing for the symptoms.    Past Medical History:  Diagnosis Date  . Abnormal Pap smear   . Kidney stones   . Obesity   . Renal disorder    Past Surgical History:  Procedure Laterality Date  . NO PAST SURGERIES     Family History  Problem Relation Age of Onset  . Miscarriages / [place]   . Hypertension Mother   . Asthma Brother   . Diabetes Paternal Grandmother    Social History  Substance Use Topics  . Smoking status: Former Smoker    Packs/day: 0.50    Types: Cigarettes  . Smokeless tobacco: Never Used  . Alcohol use No   OB History    Gravida Para Term Preterm AB Living   1       1     SAB TAB Ectopic Multiple Live Births   1             Review of Systems  Constitutional: Negative.   HENT: Positive for sore throat.   Eyes: Negative.   Respiratory: Negative.   Cardiovascular: Negative.   Gastrointestinal: Negative.   Endocrine: Negative.   Genitourinary: Negative.   Musculoskeletal: Negative.   Allergic/Immunologic: Negative.   Neurological: Negative.   Hematological: Negative.  Negative for adenopathy.  Psychiatric/Behavioral: Negative.     Allergies  Review of patient's allergies indicates no known allergies.  Home Medications   Prior to Admission medications   Medication Sig Start Date End Date Taking? Authorizing Provider  amoxicillin (AMOXIL) 875 MG tablet Take 1 tablet (875 mg total) by mouth 2 (two) times daily. 06/17/16   Deatra CanterWilliam J Oxford, FNP   fluconazole (DIFLUCAN) 150 MG tablet Take 1 tablet (150 mg total) by mouth daily. 06/17/16   Deatra CanterWilliam J Oxford, FNP  ibuprofen (ADVIL,MOTRIN) 200 MG tablet Take 400 mg by mouth every 6 (six) hours as needed for fever, headache or mild pain.    Historical Provider, MD  lidocaine (XYLOCAINE) 2 % solution Use as directed 20 mLs in the mouth or throat as needed for mouth pain. 06/17/16   Deatra CanterWilliam J Oxford, FNP   Meds Ordered and Administered this Visit  Medications - No data to display  BP 151/81 (BP Location: Right Wrist)   Pulse 77   Temp 99.5 F (37.5 C) (Oral)   LMP 06/02/2016   SpO2 100%  No data found.   Physical Exam  Urgent Care Course   Clinical Course    Procedures (including critical care time)  Labs Review Labs Reviewed  CULTURE, GROUP A STREP [facility]  POCT RAPID STREP A    Imaging Review No results found.   Visual Acuity Review  Right Eye Distance:   Left Eye Distance:   Bilateral Distance:    Right Eye Near:   Left Eye Near:    Bilateral Near:         MDM   1. Strep pharyngitis    Amoxicillin 875 mg po bid x 7 days #14  Lidocaine solution  20ml po tid prn #160ml  WSWG's Push po fluids, rest, tylenol and motrin otc prn as directed for fever, arthralgias, and myalgias.  Follow up prn if sx's continue or persist.    Deatra Canter, FNP 06/17/16 1248    Eustace Moore, MD 08/05/16 [phone]

## 2016-06-17 NOTE — ED Triage Notes (Signed)
Pt has been suffering from a sore throat for two days.  She reports a fever for the last two days.  Pts tonsils are swollen with several white spots on them and the back of the throat.

## 2016-06-18 LAB — CULTURE, GROUP A STREP (THRC)

## 2016-09-16 ENCOUNTER — Ambulatory Visit (HOSPITAL_COMMUNITY)
Admission: EM | Admit: 2016-09-16 | Discharge: 2016-09-16 | Disposition: A | Discharge status: Home / Self Care | Payer: 59 | Attending: Family Medicine | Admitting: Family Medicine

## 2016-09-16 ENCOUNTER — Encounter (HOSPITAL_COMMUNITY): Payer: Self-pay | Admitting: Emergency Medicine

## 2016-09-16 DIAGNOSIS — Z79899 Other long term (current) drug therapy: Secondary | ICD-10-CM | POA: Insufficient documentation

## 2016-09-16 DIAGNOSIS — Z0001 Encounter for general adult medical examination with abnormal findings: Secondary | ICD-10-CM | POA: Diagnosis not present

## 2016-09-16 DIAGNOSIS — J039 Acute tonsillitis, unspecified: Secondary | ICD-10-CM | POA: Insufficient documentation

## 2016-09-16 DIAGNOSIS — Z87891 Personal history of nicotine dependence: Secondary | ICD-10-CM | POA: Insufficient documentation

## 2016-09-16 DIAGNOSIS — R21 Rash and other nonspecific skin eruption: Secondary | ICD-10-CM | POA: Insufficient documentation

## 2016-09-16 LAB — POCT RAPID STREP A: STREPTOCOCCUS, GROUP A SCREEN (DIRECT): NEGATIVE

## 2016-09-16 MED ORDER — FLUCONAZOLE 150 MG PO TABS: 150.0000 mg | ORAL_TABLET | Freq: Every day | ORAL | 0 refills | Status: DC

## 2016-09-16 MED ORDER — AMOXICILLIN 875 MG PO TABS: 875.0000 mg | ORAL_TABLET | Freq: Two times a day (BID) | ORAL | 0 refills | Status: DC

## 2016-09-16 NOTE — ED Provider Notes (Signed)
CSN: [id number]     Arrival date & time 09/16/16  1114 History   None    Chief Complaint  Patient presents with  . URI  . Rash   (Consider location/radiation/quality/duration/timing/severity/associated sxs/prior Treatment) The history is provided by the patient.  URI  Presenting symptoms: sore throat   Sore throat:    Severity:  Moderate   Timing:  Constant   Progression:  Worsening Severity:  Moderate Timing:  Constant Progression:  Worsening Relieved by:  Nothing Worsened by:  Nothing Ineffective treatments:  None tried Rash  Associated symptoms: sore throat   Pt complains of a sore throat.  Pt had a rash yesterday  Past Medical History:  Diagnosis Date  . Abnormal Pap smear   . Kidney stones   . Obesity   . Renal disorder    Past Surgical History:  Procedure Laterality Date  . NO PAST SURGERIES     Family History  Problem Relation Age of Onset  . M[place]arriages / [place] Mother   . Hypertension Mother   . Asthma Brother   . Diabetes Paternal Grandmother    Social History  Substance Use Topics  . Smoking status: Former Smoker    Packs/day: 0.50    Types: Cigarettes  . Smokeless tobacco: Never Used  . Alcohol use No   OB History    Gravida Para Term Preterm AB Living   1       1     SAB TAB Ectopic Multiple Live Births   1             Review of Systems  HENT: Positive for sore throat.   Skin: Positive for rash.  All other systems reviewed and are negative.   Allergies  Patient has no known allergies.  Home Medications   Prior to Admission medications   Medication Sig Start Date End Date Taking? Authorizing Provider  amoxicillin (AMOXIL) 875 MG tablet Take 1 tablet (875 mg total) by mouth 2 (two) times daily. 09/16/16   Elson AreasLeslie K Damond Borchers, PA-C  fluconazole (DIFLUCAN) 150 MG tablet Take 1 tablet (150 mg total) by mouth daily. 09/16/16   Elson AreasLeslie K Taneah Masri, PA-C  ibuprofen (ADVIL,MOTRIN) 200 MG tablet Take 400 mg by mouth every 6 (six) hours as  needed for fever, headache or mild pain.    Historical Provider, MD  lidocaine (XYLOCAINE) 2 % solution Use as directed 20 mLs in the mouth or throat as needed for mouth pain. 06/17/16   Deatra CanterWilliam J Oxford, FNP   Meds Ordered and Administered this Visit  Medications - No data to display  BP 130/79 (BP Location: Left Arm)   Pulse 68   Temp 98.4 F (36.9 C) (Oral)   Resp 20   LMP 09/16/2016   SpO2 100%   Breastfeeding? No  No data found.   Physical Exam  Constitutional: She is oriented to person, place, and time. She appears well-developed and well-nourished.  HENT:  Head: Normocephalic.  Enlarged tonsils, no exudate,  Erythema   Eyes: EOM are normal.  Neck: Normal range of motion.  Pulmonary/Chest: Effort normal.  Abdominal: She exhibits no distension.  Musculoskeletal: Normal range of motion.  Neurological: She is alert and oriented to person, place, and time.  Skin: Skin is warm.  Psychiatric: She has a normal mood and affect.  Nursing note and vitals reviewed.   Urgent Care Course   Clinical Course     Procedures (including critical care time)  Labs Review Labs Reviewed  POCT  RAPID STREP A    Imaging Review No results found.   Visual Acuity Review  Right Eye Distance:   Left Eye Distance:   Bilateral Distance:    Right Eye Near:   Left Eye Near:    Bilateral Near:         MDM   1. Tonsillitis    An After Visit Summary was printed and given to the patient. Meds ordered this encounter  Medications  . amoxicillin (AMOXIL) 875 MG tablet    Sig: Take 1 tablet (875 mg total) by mouth 2 (two) times daily.    Dispense:  14 tablet    Refill:  0    Order Specific Question:   Supervising Provider    Answer:   Linna HoffKINDL, JAMES D [phone][[id number]]  . fluconazole (DIFLUCAN) 150 MG tablet    Sig: Take 1 tablet (150 mg total) by mouth daily.    Dispense:  2 tablet    Refill:  0    Order Specific Question:   Supervising Provider    Answer:   Bradd CanaryKINDL, JAMES D [[id number]]      Lonia SkinnerLeslie K LaurinburgSofia, PA-C 09/16/16 1252

## 2016-09-16 NOTE — Discharge Instructions (Signed)
Return if any problems.

## 2016-09-16 NOTE — ED Triage Notes (Signed)
C/o cold sx onset 8 days associated w/tonsilitis, fatigue, dysphagia, runny nose and hives that have subsided.   Denies fevers  A&O x4... NAD

## 2016-09-18 LAB — CULTURE, GROUP A STREP (THRC)

## 2016-10-14 ENCOUNTER — Inpatient Hospital Stay (HOSPITAL_COMMUNITY)
Admission: AD | Admit: 2016-10-14 | Discharge: 2016-10-14 | Disposition: A | Discharge status: Home / Self Care | Payer: 59 | Source: Ambulatory Visit | Attending: Family Medicine | Admitting: Family Medicine

## 2016-10-14 ENCOUNTER — Encounter (HOSPITAL_COMMUNITY): Payer: Self-pay | Admitting: *Deleted

## 2016-10-14 ENCOUNTER — Inpatient Hospital Stay (HOSPITAL_COMMUNITY): Payer: 59

## 2016-10-14 DIAGNOSIS — R109 Unspecified abdominal pain: Secondary | ICD-10-CM | POA: Insufficient documentation

## 2016-10-14 DIAGNOSIS — Z87891 Personal history of nicotine dependence: Secondary | ICD-10-CM | POA: Diagnosis not present

## 2016-10-14 DIAGNOSIS — Z3A01 Less than 8 weeks gestation of pregnancy: Secondary | ICD-10-CM | POA: Diagnosis not present

## 2016-10-14 DIAGNOSIS — O26891 Other specified pregnancy related conditions, first trimester: Secondary | ICD-10-CM | POA: Insufficient documentation

## 2016-10-14 DIAGNOSIS — O209 Hemorrhage in early pregnancy, unspecified: Secondary | ICD-10-CM

## 2016-10-14 DIAGNOSIS — Z3491 Encounter for supervision of normal pregnancy, unspecified, first trimester: Secondary | ICD-10-CM

## 2016-10-14 DIAGNOSIS — O26851 Spotting complicating pregnancy, first trimester: Secondary | ICD-10-CM | POA: Diagnosis not present

## 2016-10-14 HISTORY — DX: Unspecified abnormal cytological findings in specimens from vagina: R87.629

## 2016-10-14 HISTORY — DX: Chlamydial infection, unspecified: A74.9

## 2016-10-14 LAB — WET PREP, GENITAL
Clue Cells Wet Prep HPF POC: NONE SEEN
SPERM: NONE SEEN
Trich, Wet Prep: NONE SEEN
Yeast Wet Prep HPF POC: NONE SEEN

## 2016-10-14 LAB — CBC
HEMATOCRIT: 30.5 % — AB (ref 36.0–46.0)
Hemoglobin: 10.1 g/dL — ABNORMAL LOW (ref 12.0–15.0)
MCH: 26.4 pg (ref 26.0–34.0)
MCHC: 33.1 g/dL (ref 30.0–36.0)
MCV: 79.8 fL (ref 78.0–100.0)
Platelets: 261 10*3/uL (ref 150–400)
RBC: 3.82 MIL/uL — ABNORMAL LOW (ref 3.87–5.11)
RDW: 16.8 % — AB (ref 11.5–15.5)
WBC: 6.1 10*3/uL (ref 4.0–10.5)

## 2016-10-14 LAB — URINALYSIS, ROUTINE W REFLEX MICROSCOPIC
BILIRUBIN URINE: NEGATIVE
Glucose, UA: NEGATIVE mg/dL
HGB URINE DIPSTICK: NEGATIVE
KETONES UR: 5 mg/dL — AB
Leukocytes, UA: NEGATIVE
Nitrite: NEGATIVE
PH: 6 (ref 5.0–8.0)
Protein, ur: NEGATIVE mg/dL
SPECIFIC GRAVITY, URINE: 1.024 (ref 1.005–1.030)

## 2016-10-14 LAB — HCG, QUANTITATIVE, PREGNANCY: hCG, Beta Chain, Quant, S: 5386 m[IU]/mL — ABNORMAL HIGH (ref <5)

## 2016-10-14 LAB — POCT PREGNANCY, URINE: Preg Test, Ur: POSITIVE — AB

## 2016-10-14 MED ORDER — CONCEPT DHA 53.5-38-1 MG PO CAPS: 1.0000 | ORAL_CAPSULE | Freq: Every day | ORAL | 12 refills | Status: DC

## 2016-10-14 NOTE — MAU Provider Note (Signed)
Chief Complaint: Abdominal Pain and Vaginal Bleeding   First Provider Initiated Contact with Patient 10/14/16 0804      SUBJECTIVE HPI: Kaitlin Jackson is a 25 y.o. G2P0010 at 4940w3d by sure LMP who presents to maternity admissions reporting positive pregnancy test 1 week ago, cramping x 1 week and spotting x2 day, 3 days ago. She had miscarriage with last pregnancy and is concerned about this. She has not tried any treatments for pain or bleeding. The pain is cramping, like period cramps, and mild and intermittent. It has been daily x 1 week.  The spotting occurred on Saturday and Sunday only, then resolved without treatment.  Pt denies any known STD risks or symptoms but when labwork for today's evaluation was discussed she requested complete STD testing today. She denies vaginal itching/burning, urinary symptoms, h/a, dizziness, n/v, or fever/chills.     HPI  Past Medical History:  Diagnosis Date  . Abnormal Pap smear   . Chlamydia   . Kidney stones   . Obesity   . Renal disorder   . Vaginal Pap smear, abnormal    Past Surgical History:  Procedure Laterality Date  . NO PAST SURGERIES     Social History   Social History  . Marital status: Single    Spouse name: N/A  . Number of children: N/A  . Years of education: N/A   Occupational History  . Not on file.   Social History Main Topics  . Smoking status: Former Smoker    Packs/day: 0.50    Types: Cigarettes  . Smokeless tobacco: Never Used  . Alcohol use No  . Drug use: No  . Sexual activity: Yes    Birth control/ protection: None   Other Topics Concern  . Not on file   Social History Narrative  . No narrative on file   No current facility-administered medications on file prior to encounter.    Current Outpatient Prescriptions on File Prior to Encounter  Medication Sig Dispense Refill  . ibuprofen (ADVIL,MOTRIN) 200 MG tablet Take 400 mg by mouth every 6 (six) hours as needed for fever, headache or mild pain.     Marland Kitchen. lidocaine (XYLOCAINE) 2 % solution Use as directed 20 mLs in the mouth or throat as needed for mouth pain. 100 mL 0  . amoxicillin (AMOXIL) 875 MG tablet Take 1 tablet (875 mg total) by mouth 2 (two) times daily. (Patient not taking: Reported on 10/14/2016) 14 tablet 0  . fluconazole (DIFLUCAN) 150 MG tablet Take 1 tablet (150 mg total) by mouth daily. (Patient not taking: Reported on 10/14/2016) 2 tablet 0   No Known Allergies  ROS:  Review of Systems  Constitutional: Negative for chills, fatigue and fever.  Respiratory: Negative for shortness of breath.   Cardiovascular: Negative for chest pain.  Genitourinary: Positive for pelvic pain and vaginal bleeding. Negative for difficulty urinating, dysuria, flank pain, vaginal discharge and vaginal pain.  Neurological: Negative for dizziness and headaches.  Psychiatric/Behavioral: Negative.      I have reviewed patient's Past Medical Hx, Surgical Hx, Family Hx, Social Hx, medications and allergies.   Physical Exam   Patient Vitals for the past 24 hrs:  BP Temp Temp src Resp Height Weight  10/14/16 0745 118/85 99.1 F (37.3 C) Oral 18 - -  10/14/16 0739 - - - - 5\' 5"  (1.651 m) (!) 309 lb (140.2 kg)   Constitutional: Well-developed, well-nourished female in no acute distress.  Cardiovascular: normal rate Respiratory: normal effort GI:  Abd soft, non-tender. Pos BS x 4 MS: Extremities nontender, no edema, normal ROM Neurologic: Alert and oriented x 4.  GU: Neg CVAT.  PELVIC EXAM: Cervix pink, visually closed, without lesion, small amount white creamy discharge, vaginal walls and external genitalia normal Bimanual exam: Cervix 0/long/high, firm, anterior, neg CMT, uterus nontender, nonenlarged, adnexa without tenderness, enlargement, or mass   LAB RESULTS Results for orders placed or performed during the hospital encounter of 10/14/16 (from the past 24 hour(s))  Urinalysis, Routine w reflex microscopic     Status: Abnormal    Collection Time: 10/14/16  7:40 AM  Result Value Ref Range   Color, Urine YELLOW YELLOW   APPearance CLEAR CLEAR   Specific Gravity, Urine 1.024 1.005 - 1.030   pH 6.0 5.0 - 8.0   Glucose, UA NEGATIVE NEGATIVE mg/dL   Hgb urine dipstick NEGATIVE NEGATIVE   Bilirubin Urine NEGATIVE NEGATIVE   Ketones, ur 5 (A) NEGATIVE mg/dL   Protein, ur NEGATIVE NEGATIVE mg/dL   Nitrite NEGATIVE NEGATIVE   Leukocytes, UA NEGATIVE NEGATIVE  Pregnancy, urine POC     Status: Abnormal   Collection Time: 10/14/16  7:43 AM  Result Value Ref Range   Preg Test, Ur POSITIVE (A) NEGATIVE  Wet prep, genital     Status: Abnormal   Collection Time: 10/14/16  8:11 AM  Result Value Ref Range   Yeast Wet Prep HPF POC NONE SEEN NONE SEEN   Trich, Wet Prep NONE SEEN NONE SEEN   Clue Cells Wet Prep HPF POC NONE SEEN NONE SEEN   WBC, Wet Prep HPF POC FEW (A) NONE SEEN   Sperm NONE SEEN   CBC     Status: Abnormal   Collection Time: 10/14/16  8:43 AM  Result Value Ref Range   WBC 6.1 4.0 - 10.5 K/uL   RBC 3.82 (L) 3.87 - 5.11 MIL/uL   Hemoglobin 10.1 (L) 12.0 - 15.0 g/dL   HCT 16.1 (L) 09.6 - 04.5 %   MCV 79.8 78.0 - 100.0 fL   MCH 26.4 26.0 - 34.0 pg   MCHC 33.1 30.0 - 36.0 g/dL   RDW 40.9 (H) 81.1 - 91.4 %   Platelets 261 150 - 400 K/uL  hCG, quantitative, pregnancy     Status: Abnormal   Collection Time: 10/14/16  8:43 AM  Result Value Ref Range   hCG, Beta Chain, Quant, S 5,386 (H) <5 mIU/mL       IMAGING US Ob Comp Less 14 Wks  Result Date: 10/14/2016 CLINICAL DATA:  Pregnant patient with abdominal cramping. Vaginal spotting. EXAM: OBSTETRIC <14 WK [place] AND TRANSVAGINAL OB US TECHNIQUE: Both transabdominal and transvaginal ultrasound examinations were performed for complete evaluation of the gestation as well as the maternal uterus, adnexal regions, and pelvic cul-de-sac. Transvaginal technique was performed to assess early pregnancy. COMPARISON:  None. FINDINGS: Intrauterine gestational sac: Single  Yolk sac:  Visualized. Embryo:  Not Visualized. Cardiac Activity: Not Visualized. MSD: 5  mm   5 w   1  d Subchorionic hemorrhage:  Small Maternal uterus/adnexae: Normal right and left ovaries. Small free fluid in the pelvis. IMPRESSION: Probable early intrauterine gestational sac and yolk sac but no fetal pole or cardiac activity yet visualized. Recommend follow-up quantitative B-HCG levels and follow-up US in 14 days to assess viability. This recommendation follows SRU consensus guidelines: Diagnostic Criteria for Nonviable Pregnancy Early in the First Trimester. Malva Limes Med 2013; 782:9562-13. Small subchorionic hemorrhage. Small amount of free fluid in the  pelvis. Electronically Signed   By: Annia Beltrew  Davis M.D.   On: 10/14/2016 09:51   [place] Ob Transvaginal  Result Date: 10/14/2016 CLINICAL DATA:  Pregnant patient with abdominal cramping. Vaginal spotting. EXAM: OBSTETRIC <14 WK US AND TRANSVAGINAL OB US TECHNIQUE: Both transabdominal and transvaginal ultrasound examinations were performed for complete evaluation of the gestation as well as the maternal uterus, adnexal regions, and pelvic cul-de-sac. Transvaginal technique was performed to assess early pregnancy. COMPARISON:  None. FINDINGS: Intrauterine gestational sac: Single Yolk sac:  Visualized. Embryo:  Not Visualized. Cardiac Activity: Not Visualized. MSD: 5  mm   5 w   1  d Subchorionic hemorrhage:  Small Maternal uterus/adnexae: Normal right and left ovaries. Small free fluid in the pelvis. IMPRESSION: Probable early intrauterine gestational sac and yolk sac but no fetal pole or cardiac activity yet visualized. Recommend follow-up quantitative B-HCG levels and follow-up US in 14 days to assess viability. This recommendation follows SRU consensus guidelines: Diagnostic Criteria for Nonviable Pregnancy Early in the First Trimester. Malva Limes Engl J Med 2013; 409:8119-14; 369:1443-51. Small subchorionic hemorrhage. Small amount of free fluid in the pelvis. Electronically Signed    By: Annia Beltrew  Davis M.D.   On: 10/14/2016 09:51    MAU Management/MDM: Ordered labs and US and reviewed results.  IUP confirmed by US but no fetal pole today, c/w early pregnancy.  Outpatient viability US ordered and pt to f/u with prenatal care as soon as possible, given list of providers. Rx for Concept DHA prenatal vitamins.  First trimester and bleeding precautions reviewed. Pt stable at time of discharge.  ASSESSMENT 1. Abdominal pain during pregnancy in first trimester   2. Vaginal bleeding in pregnancy, first trimester   3. Normal IUP (intrauterine pregnancy) on prenatal ultrasound, first trimester     PLAN Discharge home Allergies as of 10/14/2016   No Known Allergies     Medication List    STOP taking these medications   amoxicillin 875 MG tablet Commonly known as:  AMOXIL   fluconazole 150 MG tablet Commonly known as:  DIFLUCAN   ibuprofen 200 MG tablet Commonly known as:  ADVIL,MOTRIN   lidocaine 2 % solution Commonly known as:  XYLOCAINE     TAKE these medications   CONCEPT DHA 53.5-38-1 MG Caps Take 1 capsule by mouth daily.      Follow-up Information    [facility]   Specialty:  Obstetrics and Gynecology Why:  Or prenatal provider of your choice, return to MAU as needed for emergencies. Contact information: [address] [place] [id number] [phone]       [facility] Follow up.   Specialty:  Radiology Why:  Ultrasound will call you with follow up appointment.    Contact information: [address] [id number] [place] [id number] [phone]          Sharen CounterLisa Leftwich-Kirby Certified Nurse-Midwife 10/14/2016  10:19 AM

## 2016-10-14 NOTE — MAU Note (Signed)
+   HPT X 2. Started cramping and spotting 5 days ago.

## 2016-10-14 NOTE — Discharge Instructions (Signed)
[facility] Providers  ° ° °[facility]       Phone: [phone] ° °[facility] Phone: [phone] ° °[facility] [place]  Phone: [phone] ° °[facility] [place]  Phone: [phone] ° °[facility] [place]  Phone: [phone] ° °[facility]       Phone: [phone] ° °[facility]    Phone: [phone]  ° °[facility]    Phone: [phone] ° °[facility]    Phone: [phone] ° °[facility]    Phone: [phone] ° °[facility]    Phone: [phone] ° °[facility]       Phone: [phone]  ° °[facility]  Phone: [phone] ° °[facility]    Phone: [phone] ° °[facility]    Phone: [phone] ° °[facility]      Phone: [phone] ° °[facility]    Phone: [phone] ° °

## 2016-10-15 LAB — GC/CHLAMYDIA PROBE AMP (~~LOC~~) NOT AT ARMC
CHLAMYDIA, DNA PROBE: NEGATIVE
NEISSERIA GONORRHEA: NEGATIVE

## 2016-10-15 LAB — RPR: RPR Ser Ql: NONREACTIVE

## 2016-10-15 LAB — HIV ANTIBODY (ROUTINE TESTING W REFLEX): HIV SCREEN 4TH GENERATION: NONREACTIVE

## 2016-10-15 LAB — HCV COMMENT:

## 2016-10-15 LAB — HEPATITIS C ANTIBODY (REFLEX)

## 2016-10-18 NOTE — L&D Delivery Note (Signed)
Patient is 26 y.o. G2P0010 7248w2d admitted for IOL for gestational hypertension. S/p IOL with cytotec, followed by Pitocin.     Delivery Note At 10:37 AM a viable female was delivered via Vaginal, Spontaneous Delivery (Presentation: LOA).  APGAR: 8, 9; weight pending.   Placenta status: spontaneous, intact.  Cord: three vessel  Anesthesia:  Epidural Episiotomy: None Lacerations: 1st degree Suture Repair: 3.0 vicryl Est. Blood Loss (mL): 150  Mom to postpartum.  Baby to Couplet care / Skin to Skin.    Upon arrival to room patient was complete and pushing. She pushed with good maternal effort to deliver a viable female infant in cephalic, LOA position. No nuchal cord present. Baby delivered without difficulty  and was noted to have good tone and placed on maternal abdomen for oral suctioning, drying and stimulation. Delayed cord clamping performed. Placenta delivered spontaneously with gentle cord traction. Fundus firm with massage and Pitocin. Perineum inspected and found to have first degree laceration, which was repaired with 3-0 vicryl with good hemostasis achieved. Counts of sharps, instruments, and lap pads were all correct.   Jearld LeschMary K Tyanna Hach, DO PGY-2 06/22/2017, 11:30 AM

## 2016-10-21 ENCOUNTER — Ambulatory Visit: Payer: 59

## 2016-10-21 ENCOUNTER — Ambulatory Visit (HOSPITAL_COMMUNITY)
Admission: RE | Admit: 2016-10-21 | Discharge: 2016-10-21 | Disposition: A | Discharge status: Home / Self Care | Payer: 59 | Source: Ambulatory Visit | Attending: Advanced Practice Midwife | Admitting: Advanced Practice Midwife

## 2016-10-21 DIAGNOSIS — O26891 Other specified pregnancy related conditions, first trimester: Secondary | ICD-10-CM | POA: Diagnosis not present

## 2016-10-21 DIAGNOSIS — O208 Other hemorrhage in early pregnancy: Secondary | ICD-10-CM | POA: Diagnosis not present

## 2016-10-21 DIAGNOSIS — Z3A01 Less than 8 weeks gestation of pregnancy: Secondary | ICD-10-CM

## 2016-10-21 DIAGNOSIS — R109 Unspecified abdominal pain: Secondary | ICD-10-CM | POA: Insufficient documentation

## 2016-10-22 NOTE — Progress Notes (Signed)
Pt here today US results.  Reviewed US with Walidah.  Pt notified that she is pregnant that the front office will provider her a proof of pregnancy letter to start prenatal care.  Pt stated understanding with no further questions.

## 2016-11-03 ENCOUNTER — Inpatient Hospital Stay (HOSPITAL_COMMUNITY): Payer: 59

## 2016-11-03 ENCOUNTER — Encounter (HOSPITAL_COMMUNITY): Payer: Self-pay | Admitting: *Deleted

## 2016-11-03 ENCOUNTER — Inpatient Hospital Stay (HOSPITAL_COMMUNITY)
Admission: AD | Admit: 2016-11-03 | Discharge: 2016-11-03 | Disposition: A | Discharge status: Home / Self Care | Payer: 59 | Source: Ambulatory Visit | Attending: Obstetrics and Gynecology | Admitting: Obstetrics and Gynecology

## 2016-11-03 DIAGNOSIS — Z3A01 Less than 8 weeks gestation of pregnancy: Secondary | ICD-10-CM | POA: Diagnosis not present

## 2016-11-03 DIAGNOSIS — R102 Pelvic and perineal pain: Secondary | ICD-10-CM | POA: Diagnosis not present

## 2016-11-03 DIAGNOSIS — O219 Vomiting of pregnancy, unspecified: Secondary | ICD-10-CM | POA: Diagnosis not present

## 2016-11-03 DIAGNOSIS — O209 Hemorrhage in early pregnancy, unspecified: Secondary | ICD-10-CM | POA: Diagnosis not present

## 2016-11-03 DIAGNOSIS — O26891 Other specified pregnancy related conditions, first trimester: Secondary | ICD-10-CM | POA: Insufficient documentation

## 2016-11-03 DIAGNOSIS — N949 Unspecified condition associated with female genital organs and menstrual cycle: Secondary | ICD-10-CM

## 2016-11-03 DIAGNOSIS — O208 Other hemorrhage in early pregnancy: Secondary | ICD-10-CM | POA: Diagnosis not present

## 2016-11-03 DIAGNOSIS — Z87891 Personal history of nicotine dependence: Secondary | ICD-10-CM | POA: Insufficient documentation

## 2016-11-03 DIAGNOSIS — R109 Unspecified abdominal pain: Secondary | ICD-10-CM

## 2016-11-03 LAB — URINALYSIS, ROUTINE W REFLEX MICROSCOPIC
Bilirubin Urine: NEGATIVE
Glucose, UA: NEGATIVE mg/dL
Hgb urine dipstick: NEGATIVE
Ketones, ur: 20 mg/dL — AB
Leukocytes, UA: NEGATIVE
NITRITE: NEGATIVE
PROTEIN: 30 mg/dL — AB
SPECIFIC GRAVITY, URINE: 1.02 (ref 1.005–1.030)
pH: 6 (ref 5.0–8.0)

## 2016-11-03 NOTE — Discharge Instructions (Signed)
Subchorionic Hematoma A subchorionic hematoma is a gathering of blood between the outer wall of the placenta and the inner wall of the womb (uterus). The placenta is the organ that connects the fetus to the wall of the uterus. The placenta performs the feeding, breathing (oxygen to the fetus), and waste removal (excretory work) of the fetus.  Subchorionic hematoma is the most common abnormality found on a result from ultrasonography done during the first trimester or early second trimester of pregnancy. If there has been little or no vaginal bleeding, early small hematomas usually shrink on their own and do not affect your baby or pregnancy. The blood is gradually absorbed over 1-2 weeks. When bleeding starts later in pregnancy or the hematoma is larger or occurs in an older pregnant woman, the outcome may not be as good. Larger hematomas may get bigger, which increases the chances for miscarriage. Subchorionic hematoma also increases the risk of premature detachment of the placenta from the uterus, preterm (premature) labor, and stillbirth. HOME CARE INSTRUCTIONS  Stay on bed rest if your health care provider recommends this. Although bed rest will not prevent more bleeding or prevent a miscarriage, your health care provider may recommend bed rest until you are advised otherwise.  Avoid heavy lifting (more than 10 lb [4.5 kg]), exercise, sexual intercourse, or douching as directed by your health care provider.  Keep track of the number of pads you use each day and how soaked (saturated) they are. Write down this information.  Do not use tampons.  Keep all follow-up appointments as directed by your health care provider. Your health care provider may ask you to have follow-up blood tests or ultrasound tests or both. SEEK IMMEDIATE MEDICAL CARE IF:  You have severe cramps in your stomach, back, abdomen, or pelvis.  You have a fever.  You pass large clots or tissue. Save any tissue for your  health care provider to look at.  Your bleeding increases or you become lightheaded, feel weak, or have fainting episodes. This information is not intended to replace advice given to you by your health care provider. Make sure you discuss any questions you have with your health care provider. Document Released: 01/19/2007 Document Revised: 10/25/2014 Document Reviewed: 05/03/2013 Elsevier Interactive Patient Education  2017 Elsevier Inc.      Round Ligament Pain Introduction The round ligament is a cord of muscle and tissue that helps to support the uterus. It can become a source of pain during pregnancy if it becomes stretched or twisted as the baby grows. The pain usually begins in the second trimester of pregnancy, and it can come and go until the baby is delivered. It is not a serious problem, and it does not cause harm to the baby. Round ligament pain is usually a short, sharp, and pinching pain, but it can also be a dull, lingering, and aching pain. The pain is felt in the lower side of the abdomen or in the groin. It usually starts deep in the groin and moves up to the outside of the hip area. Pain can occur with:  A sudden change in position.  Rolling over in bed.  Coughing or sneezing.  Physical activity. Follow these instructions at home: Watch your condition for any changes. Take these steps to help with your pain:  When the pain starts, relax. Then try:  Sitting down.  Flexing your knees up to your abdomen.  Lying on your side with one pillow under your abdomen and another pillow  between your legs.  Sitting in a warm bath for 15-20 minutes or until the pain goes away.  Take over-the-counter and prescription medicines only as told by your health care provider.  Move slowly when you sit and stand.  Avoid long walks if they cause pain.  Stop or lessen your physical activities if they cause pain. Contact a health care provider if:  Your pain does not go away with  treatment.  You feel pain in your back that you did not have before.  Your medicine is not helping. Get help right away if:  You develop a fever or chills.  You develop uterine contractions.  You develop vaginal bleeding.  You develop nausea or vomiting.  You develop diarrhea.  You have pain when you urinate. This information is not intended to replace advice given to you by your health care provider. Make sure you discuss any questions you have with your health care provider. Document Released: 07/13/2008 Document Revised: 03/11/2016 Document Reviewed: 12/11/2014  2017 Elsevier

## 2016-11-03 NOTE — MAU Provider Note (Signed)
History   Patient Ernest PineKenyatta Raju is a 26 year old G2P0010 at 7 weeks and 2 days by US  here with complaints of abdominal pain. She has had a miscarriage in the past and is very anxious that she might be having another one. She is a [employer] and came up here to get checked out because she was concerned after she started having suprapubic pain.   CSN: [id number]  Arrival date and time: 11/03/16 1350   None     Chief Complaint  Patient presents with  . Abdominal Pain   Abdominal Pain  This is a new problem. The current episode started in the past 7 days. The problem occurs intermittently. The problem has been unchanged. The pain is located in the suprapubic region, left flank and generalized abdominal region. The pain is at a severity of 5/10. The pain is moderate. The quality of the pain is cramping. Associated symptoms include nausea and vomiting. Pertinent negatives include no constipation, diarrhea, dysuria or hematuria. Associated symptoms comments: Reports normal heartburn of pregnancy after she eats. Nothing aggravates the pain. The pain is relieved by certain positions (it hurts less when she lays on her left side). history of kidney stone in 2003; none since.     OB History    Gravida Para Term Preterm AB Living   2       1     SAB TAB Ectopic Multiple Live Births   1              Past Medical History:  Diagnosis Date  . Abnormal Pap smear   . Chlamydia   . Kidney stones   . Obesity   . Renal disorder   . Vaginal Pap smear, abnormal     Past Surgical History:  Procedure Laterality Date  . NO PAST SURGERIES      Family History  Problem Relation Age of Onset  . Miscarriages / [place] Mother   . Hypertension Mother   . Asthma Brother   . Diabetes Paternal Grandmother     Social History  Substance Use Topics  . Smoking status: Former Smoker    Packs/day: 0.50    Types: Cigarettes  . Smokeless tobacco: Never Used  . Alcohol use No     Allergies: No Known Allergies  Prescriptions Prior to Admission  Medication Sig Dispense Refill Last Dose  . Prenat-FeFum-FePo-FA-Omega 3 (CONCEPT DHA) 53.5-38-1 MG CAPS Take 1 capsule by mouth daily. 30 capsule 12 11/02/2016 at Unknown time    Review of Systems  Constitutional: Negative.   HENT: Negative.   Eyes: Negative.   Respiratory: Negative.   Cardiovascular: Negative.   Gastrointestinal: Positive for abdominal pain, nausea and vomiting. Negative for constipation and diarrhea.  Endocrine: Negative.   Genitourinary: Negative for dysuria and hematuria.  Musculoskeletal: Negative.   Skin: Negative.   Neurological: Negative.   Hematological: Negative.   Psychiatric/Behavioral: Negative.   No usual discharge; no vaginal itching or odor. No pain with urination.  Physical Exam   Blood pressure 126/66, pulse 71, temperature 98.7 F (37.1 C), temperature source Oral, resp. rate 18, weight 297 lb (134.7 kg), last menstrual period 09/13/2016.  Physical Exam  Constitutional: She is oriented to person, place, and time. She appears well-developed and well-nourished.  HENT:  Head: Normocephalic.  Neck: Normal range of motion.  Respiratory: Effort normal. No respiratory distress. She has no wheezes. She has no rales. She exhibits no tenderness.  GI: Soft.  Genitourinary: No vaginal  discharge found.  Musculoskeletal: Normal range of motion.  Neurological: She is alert and oriented to person, place, and time.  Skin: Skin is warm and dry.    MAU Course  Procedures  MDM -patient  had a documented IUP on 10/14/2016 with a small subchorionic hemorrhage. [place] today shows cardiac activity of 169; gestation of 7 weeks and 5 days. Subchorionic hemorrhage is unchanged. Patient's urine is cloudy but no nitrites or leukocytes.   Assessment and Plan   1. Round ligament pain   2. Abdominal pain    2. Patient to be discharged with instructions on normal pregnancy-related anatomical  changes. Patient to keep her appointment on 11/29/2016 at [facility] clinic.   Charlesetta Garibaldi Imara Standiford CNM 11/03/2016, 2:36 PM

## 2016-11-03 NOTE — MAU Note (Signed)
Pain in left flank and lower abd started 2 days ago.  Comes and goes, stabbing sensation.  Denies diarrhea, is vomiting and having constipation.  Denies bleeding

## 2016-11-29 ENCOUNTER — Encounter: Payer: Self-pay | Admitting: Obstetrics & Gynecology

## 2016-11-29 ENCOUNTER — Ambulatory Visit (INDEPENDENT_AMBULATORY_CARE_PROVIDER_SITE_OTHER): Payer: 59 | Admitting: Obstetrics & Gynecology

## 2016-11-29 VITALS — BP 124/79 | HR 80 | Wt 286.5 lb

## 2016-11-29 DIAGNOSIS — O26891 Other specified pregnancy related conditions, first trimester: Secondary | ICD-10-CM

## 2016-11-29 DIAGNOSIS — O99211 Obesity complicating pregnancy, first trimester: Secondary | ICD-10-CM

## 2016-11-29 DIAGNOSIS — O219 Vomiting of pregnancy, unspecified: Secondary | ICD-10-CM

## 2016-11-29 DIAGNOSIS — Z3689 Encounter for other specified antenatal screening: Secondary | ICD-10-CM

## 2016-11-29 DIAGNOSIS — E669 Obesity, unspecified: Secondary | ICD-10-CM

## 2016-11-29 DIAGNOSIS — Z3400 Encounter for supervision of normal first pregnancy, unspecified trimester: Secondary | ICD-10-CM | POA: Diagnosis not present

## 2016-11-29 DIAGNOSIS — O99210 Obesity complicating pregnancy, unspecified trimester: Secondary | ICD-10-CM | POA: Diagnosis not present

## 2016-11-29 DIAGNOSIS — N898 Other specified noninflammatory disorders of vagina: Secondary | ICD-10-CM | POA: Diagnosis not present

## 2016-11-29 DIAGNOSIS — Z113 Encounter for screening for infections with a predominantly sexual mode of transmission: Secondary | ICD-10-CM

## 2016-11-29 DIAGNOSIS — Z3490 Encounter for supervision of normal pregnancy, unspecified, unspecified trimester: Secondary | ICD-10-CM | POA: Insufficient documentation

## 2016-11-29 LAB — COMPREHENSIVE METABOLIC PANEL
ALBUMIN: 3.7 g/dL (ref 3.6–5.1)
ALK PHOS: 56 U/L (ref 33–115)
ALT: 26 U/L (ref 6–29)
AST: 23 U/L (ref 10–30)
BILIRUBIN TOTAL: 0.4 mg/dL (ref 0.2–1.2)
BUN: 5 mg/dL — ABNORMAL LOW (ref 7–25)
CALCIUM: 9.7 mg/dL (ref 8.6–10.2)
CO2: 21 mmol/L (ref 20–31)
Chloride: 104 mmol/L (ref 98–110)
Creat: 0.59 mg/dL (ref 0.50–1.10)
GLUCOSE: 97 mg/dL (ref 65–99)
POTASSIUM: 4.1 mmol/L (ref 3.5–5.3)
Sodium: 134 mmol/L — ABNORMAL LOW (ref 135–146)
Total Protein: 6.8 g/dL (ref 6.1–8.1)

## 2016-11-29 LAB — HEMOGLOBIN A1C
HEMOGLOBIN A1C: 5.2 % (ref <5.7)
Mean Plasma Glucose: 103 mg/dL

## 2016-11-29 MED ORDER — PREPLUS 27-1 MG PO TABS: 1.0000 | ORAL_TABLET | Freq: Every day | ORAL | 13 refills | Status: DC

## 2016-11-29 MED ORDER — TERCONAZOLE 0.4 % VA CREA: 1.0000 | TOPICAL_CREAM | Freq: Every day | VAGINAL | 0 refills | Status: DC

## 2016-11-29 MED ORDER — METOCLOPRAMIDE HCL 10 MG PO TABS: 10.0000 mg | ORAL_TABLET | Freq: Four times a day (QID) | ORAL | 2 refills | Status: DC | PRN

## 2016-11-29 MED ORDER — ONDANSETRON 4 MG PO TBDP: 4.0000 mg | ORAL_TABLET | Freq: Four times a day (QID) | ORAL | 0 refills | Status: DC | PRN

## 2016-11-29 NOTE — Patient Instructions (Signed)
Return to clinic for any scheduled appointments or obstetric concerns, or go to [facility]U for evaluation Second Trimester of Pregnancy The second trimester is from week 13 through week 28 (months 4 through 6). The second trimester is often a time when you feel your best. Your body has also adjusted to being pregnant, and you begin to feel better physically. Usually, morning sickness has lessened or quit completely, you may have more energy, and you may have an increase in appetite. The second trimester is also a time when the fetus is growing rapidly. At the end of the sixth month, the fetus is about 9 inches long and weighs about 1 pounds. You will likely begin to feel the baby move (quickening) between 18 and 20 weeks of the pregnancy. Body changes during your second trimester Your body continues to go through many changes during your second trimester. The changes vary from woman to woman.  Your weight will continue to increase. You will notice your lower abdomen bulging out.  You may begin to get stretch marks on your hips, abdomen, and breasts.  You may develop headaches that can be relieved by medicines. The medicines should be approved by your health care provider.  You may urinate more often because the fetus is pressing on your bladder.  You may develop or continue to have heartburn as a result of your pregnancy.  You may develop constipation because certain hormones are causing the muscles that push waste through your intestines to slow down.  You may develop hemorrhoids or swollen, bulging veins (varicose veins).  You may have back pain. This is caused by:  Weight gain.  Pregnancy hormones that are relaxing the joints in your pelvis.  A shift in weight and the muscles that support your balance.  Your breasts will continue to grow and they will continue to become tender.  Your gums may bleed and may be sensitive to brushing and flossing.  Dark spots or blotches (chloasma, mask of  pregnancy) may develop on your face. This will likely fade after the baby is born.  A dark line from your belly button to the pubic area (linea nigra) may appear. This will likely fade after the baby is born.  You may have changes in your hair. These can include thickening of your hair, rapid growth, and changes in texture. Some women also have hair loss during or after pregnancy, or hair that feels dry or thin. Your hair will most likely return to normal after your baby is born. What to expect at prenatal visits During a routine prenatal visit:  You will be weighed to make sure you and the fetus are growing normally.  Your blood pressure will be taken.  Your abdomen will be measured to track your baby's growth.  The fetal heartbeat will be listened to.  Any test results from the previous visit will be discussed. Your health care provider may ask you:  How you are feeling.  If you are feeling the baby move.  If you have had any abnormal symptoms, such as leaking fluid, bleeding, severe headaches, or abdominal cramping.  If you are using any tobacco products, including cigarettes, chewing tobacco, and electronic cigarettes.  If you have any questions. Other tests that may be performed during your second trimester include:  Blood tests that check for:  Low iron levels (anemia).  Gestational diabetes (between 24 and 28 weeks).  Rh antibodies. This is to check for a protein on red blood cells (Rh factor).    Urine tests to check for infections, diabetes, or protein in the urine.  An ultrasound to confirm the proper growth and development of the baby.  An amniocentesis to check for possible genetic problems.  Fetal screens for spina bifida and Down syndrome.  HIV (human immunodeficiency virus) testing. Routine prenatal testing includes screening for HIV, unless you choose not to have this test. Follow these instructions at home: Eating and drinking  Continue to eat regular,  healthy meals.  Avoid raw meat, uncooked cheese, cat litter boxes, and soil used by cats. These carry germs that can cause birth defects in the baby.  Take your prenatal vitamins.  Take 1500-2000 mg of calcium daily starting at the 20th week of pregnancy until you deliver your baby.  If you develop constipation:  Take over-the-counter or prescription medicines.  Drink enough fluid to keep your urine clear or pale yellow.  Eat foods that are high in fiber, such as fresh fruits and vegetables, whole grains, and beans.  Limit foods that are high in fat and processed sugars, such as fried and sweet foods. Activity  Exercise only as directed by your health care provider. Experiencing uterine cramps is a good sign to stop exercising.  Avoid heavy lifting, wear low heel shoes, and practice good posture.  Wear your seat belt at all times when driving.  Rest with your legs elevated if you have leg cramps or low back pain.  Wear a good support bra for breast tenderness.  Do not use hot tubs, steam rooms, or saunas. Lifestyle  Avoid all smoking, herbs, alcohol, and unprescribed drugs. These chemicals affect the formation and growth of the baby.  Do not use any products that contain nicotine or tobacco, such as cigarettes and e-cigarettes. If you need help quitting, ask your health care provider.  A sexual relationship may be continued unless your health care provider directs you otherwise. General instructions  Follow your health care provider's instructions regarding medicine use. There are medicines that are either safe or unsafe to take during pregnancy.  Take warm sitz baths to soothe any pain or discomfort caused by hemorrhoids. Use hemorrhoid cream if your health care provider approves.  If you develop varicose veins, wear support hose. Elevate your feet for 15 minutes, 3-4 times a day. Limit salt in your diet.  Visit your dentist if you have not gone yet during your  pregnancy. Use a soft toothbrush to brush your teeth and be gentle when you floss.  Keep all follow-up prenatal visits as told by your health care provider. This is important. Contact a health care provider if:  You have dizziness.  You have mild pelvic cramps, pelvic pressure, or nagging pain in the abdominal area.  You have persistent nausea, vomiting, or diarrhea.  You have a bad smelling vaginal discharge.  You have pain with urination. Get help right away if:  You have a fever.  You are leaking fluid from your vagina.  You have spotting or bleeding from your vagina.  You have severe abdominal cramping or pain.  You have rapid weight gain or weight loss.  You have shortness of breath with chest pain.  You notice sudden or extreme swelling of your face, hands, ankles, feet, or legs.  You have not felt your baby move in over an hour.  You have severe headaches that do not go away with medicine.  You have vision changes. Summary  The second trimester is from week 13 through week 28 (months 4 through   6). It is also a time when the fetus is growing rapidly.  Your body goes through many changes during pregnancy. The changes vary from woman to woman.  Avoid all smoking, herbs, alcohol, and unprescribed drugs. These chemicals affect the formation and growth your baby.  Do not use any tobacco products, such as cigarettes, chewing tobacco, and e-cigarettes. If you need help quitting, ask your health care provider.  Contact your health care provider if you have any questions. Keep all prenatal visits as told by your health care provider. This is important. This information is not intended to replace advice given to you by your health care provider. Make sure you discuss any questions you have with your health care provider. Document Released: 09/28/2001 Document Revised: 03/11/2016 Document Reviewed: 12/05/2012 Elsevier Interactive Patient Education  2017 [employer]  

## 2016-11-29 NOTE — Progress Notes (Signed)
Pt reports nausea and vomiting. First trimester screening scheduled for 3/2 at 1100 am.

## 2016-11-29 NOTE — Progress Notes (Signed)
Subjective:   Kaitlin Jackson is a 26 y.o. G2P0010 at [redacted]w[redacted]d by LMP, early ultrasound being seen today for her first obstetrical visit.  Her obstetrical history is significant for obesity. Patient does intend to breast feed. Pregnancy history fully reviewed.  Patient reports nausea, vomiting and vaginal irritation. No other symptoms.  HISTORY: Obstetric History   G2   P0   T0   P0   A1   L0    SAB1   TAB0   Ectopic0   Multiple0   Live Births0     # Outcome Date GA Lbr Len/2nd Weight Sex Delivery Anes PTL Lv  2 Current           1 SAB              Past Medical History:  Diagnosis Date  . Abnormal Pap smear   . Chlamydia   . Kidney stones   . Obesity   . Renal disorder   . Vaginal Pap smear, abnormal    Past Surgical History:  Procedure Laterality Date  . NO PAST SURGERIES     Family History  Problem Relation Age of Onset  . Miscarriages / [place] Mother   . Hypertension Mother   . Asthma Brother   . Diabetes Paternal Grandmother    Social History  Substance Use Topics  . Smoking status: Former Smoker    Packs/day: 0.50    Types: Cigarettes  . Smokeless tobacco: Never Used  . Alcohol use No   No Known Allergies Current Outpatient Prescriptions on File Prior to Visit  Medication Sig Dispense Refill  . Prenat-FeFum-FePo-FA-Omega 3 (CONCEPT DHA) 53.5-38-1 MG CAPS Take 1 capsule by mouth daily. 30 capsule 12   No current facility-administered medications on file prior to visit.      Exam   Vitals:   11/29/16 0928  BP: 124/79  Pulse: 80  Weight: 286 lb 8 oz (130 kg)   Fetal Heart Rate (bpm): 155 on bedside ultrasound.  Uterus:     Pelvic Exam: Perineum: no hemorrhoids, normal perineum   Vulva: normal external genitalia, +excoriation and superficial laceration due to inflammation, +erythema   Vagina:  normal mucosa, white discharge seen, testing sample obtained   Cervix: not evaluated. Had normal pap last yeasr   Adnexa: not evaluated   Bony  Pelvis: average  System: General: well-developed, well-nourished female in no acute distress   Breast:  normal appearance, no masses or tenderness   Skin: normal coloration and turgor, no rashes   Neurologic: oriented, normal, negative, normal mood   Extremities: normal strength, tone, and muscle mass, ROM of all joints is normal   HEENT PERRLA, extraocular movement intact and sclera clear, anicteric   Mouth/Teeth mucous membranes moist, pharynx normal without lesions and dental hygiene good   Neck supple and no masses   Cardiovascular: regular rate and rhythm   Respiratory:  no respiratory distress, normal breath sounds   Abdomen: soft, obese, non-tender; bowel sounds normal; no masses,  no organomegaly     Assessment:   Pregnancy: G2P0010 Patient Active Problem List   Diagnosis Date Noted  . Supervision of normal pregnancy 11/29/2016  . Obesity in pregnancy, antepartum 11/29/2016     Plan:  1. Nausea and vomiting during pregnancy Patient desires non-sedating medications. Recommended OTC Vitamin B6. If it does not work, then Reglan, then Zofran.  - ondansetron (ZOFRAN ODT) 4 MG disintegrating tablet; Take 1 tablet (4 mg total) by mouth every 6 (  six) hours as needed for nausea.  Dispense: 20 tablet; Refill: 0 - metoCLOPramide (REGLAN) 10 MG tablet; Take 1 tablet (10 mg total) by mouth 4 (four) times daily as needed for nausea or vomiting.  Dispense: 30 tablet; Refill: 2  2. Vaginal discharge during pregnancy in first trimester Likely yeast, will follow up testing.  - terconazole (TERAZOL 7) 0.4 % vaginal cream; Place 1 applicator vaginally at bedtime. Use for seven days  Dispense: 45 g; Refill: 0 - Cervicovaginal ancillary only  3. Obesity in pregnancy, antepartum Counseled about appropriate weight gain in pregnancy; importance of proper diet and exercise. - Hemoglobin A1c - Comprehensive metabolic panel - Referral to Nutrition and Diabetes Services  4. Encounter for fetal  anatomic survey Anatomy scan ordered - US MFM OB COMP + 14 WK; Future  5. Supervision of normal first pregnancy, antepartum Initial labs drawn. First trimester screen ordered, patient knows she will get AFP screen next visit.  Patient enrolled in [facility].  - US MFM Fetal Nuchal Translucency; Future - Prenatal Profile - Pain Mgmt, Profile 6 Conf w/o mM, U - Culture, OB Urine - Hemoglobin A1c - Comprehensive metabolic panel - US MFM OB COMP + 14 WK; Future - Prenatal Vit-Fe Fumarate-FA (PREPLUS) 27-1 MG TABS; Take 1 tablet by mouth daily.  Dispense: 30 tablet; Refill: 13 Continue prenatal vitamins; was throwing up DHA based ones due to "smelling like fish oil". Regular prenatal vitamins prescribed.  Problem list reviewed and updated. The nature of [facility] with multiple MDs and other Advanced Practice Providers was explained to patient; also emphasized that residents, students are part of our team. Routine obstetric precautions reviewed. Return in about 9 weeks (around 01/31/2017) for OB Visit (20 weeks [facility]cripts).     Jaynie CollinsUGONNA  Nathanuel Cabreja, MD, FACOG Attending Obstetrician & Gynecologist, [facility], [facility]

## 2016-11-30 LAB — PRENATAL PROFILE (SOLSTAS)
Antibody Screen: NEGATIVE
BASOS ABS: 0 {cells}/uL (ref 0–200)
BASOS PCT: 0 %
EOS ABS: 0 {cells}/uL — AB (ref 15–500)
Eosinophils Relative: 0 %
HCT: 34.5 % — ABNORMAL LOW (ref 35.0–45.0)
HEMOGLOBIN: 11 g/dL — AB (ref 11.7–15.5)
HEP B S AG: NEGATIVE
HIV 1&2 Ab, 4th Generation: NONREACTIVE
LYMPHS ABS: 1564 {cells}/uL (ref 850–3900)
Lymphocytes Relative: 23 %
MCH: 27.3 pg (ref 27.0–33.0)
MCHC: 31.9 g/dL — AB (ref 32.0–36.0)
MCV: 85.6 fL (ref 80.0–100.0)
MPV: 10.2 fL (ref 7.5–12.5)
Monocytes Absolute: 408 cells/uL (ref 200–950)
Monocytes Relative: 6 %
NEUTROS ABS: 4828 {cells}/uL (ref 1500–7800)
Neutrophils Relative %: 71 %
Platelets: 294 10*3/uL (ref 140–400)
RBC: 4.03 MIL/uL (ref 3.80–5.10)
RDW: 17.7 % — ABNORMAL HIGH (ref 11.0–15.0)
Rh Type: POSITIVE
WBC: 6.8 10*3/uL (ref 3.8–10.8)

## 2016-11-30 LAB — CERVICOVAGINAL ANCILLARY ONLY
BACTERIAL VAGINITIS: NEGATIVE
CANDIDA VAGINITIS: POSITIVE — AB
Chlamydia: NEGATIVE
NEISSERIA GONORRHEA: NEGATIVE
Trichomonas: NEGATIVE

## 2016-12-01 ENCOUNTER — Encounter: Payer: Self-pay | Admitting: Obstetrics & Gynecology

## 2016-12-01 DIAGNOSIS — Z2839 Other underimmunization status: Secondary | ICD-10-CM | POA: Insufficient documentation

## 2016-12-01 DIAGNOSIS — O9989 Other specified diseases and conditions complicating pregnancy, childbirth and the puerperium: Secondary | ICD-10-CM

## 2016-12-01 DIAGNOSIS — Z283 Underimmunization status: Secondary | ICD-10-CM | POA: Insufficient documentation

## 2016-12-01 DIAGNOSIS — O09899 Supervision of other high risk pregnancies, unspecified trimester: Secondary | ICD-10-CM | POA: Insufficient documentation

## 2016-12-01 LAB — CULTURE, OB URINE
COLONY COUNT: NO GROWTH
Organism ID, Bacteria: NO GROWTH

## 2016-12-01 MED FILL — ONDANSETRON ODT 4 MG TABLET: 4 | 5 days supply | Qty: 20 | Fill #0

## 2016-12-04 LAB — PAIN MGMT, PROFILE 6 CONF W/O MM, U
6 Acetylmorphine: NEGATIVE ng/mL (ref <10)
ALCOHOL METABOLITES: NEGATIVE ng/mL (ref <500)
Amphetamines: NEGATIVE ng/mL (ref <500)
BARBITURATES: NEGATIVE ng/mL (ref <300)
BENZODIAZEPINES: NEGATIVE ng/mL (ref <100)
COCAINE METABOLITE: NEGATIVE ng/mL (ref <150)
CREATININE: 267 mg/dL (ref >=20.0)
MARIJUANA METABOLITE: 119 ng/mL — AB (ref <5)
MARIJUANA METABOLITE: POSITIVE ng/mL — AB (ref <20)
METHADONE METABOLITE: NEGATIVE ng/mL (ref <100)
OPIATES: NEGATIVE ng/mL (ref <100)
OXYCODONE: NEGATIVE ng/mL (ref <100)
Oxidant: NEGATIVE ug/mL (ref <200)
PHENCYCLIDINE: NEGATIVE ng/mL (ref <25)
Please note:: 0
pH: 6.89 (ref 4.5–9.0)

## 2016-12-07 ENCOUNTER — Encounter (HOSPITAL_COMMUNITY): Payer: Self-pay | Admitting: Obstetrics & Gynecology

## 2016-12-07 ENCOUNTER — Encounter: Payer: Self-pay | Admitting: Obstetrics & Gynecology

## 2016-12-07 DIAGNOSIS — F1290 Cannabis use, unspecified, uncomplicated: Secondary | ICD-10-CM | POA: Insufficient documentation

## 2016-12-16 ENCOUNTER — Other Ambulatory Visit: Payer: Self-pay

## 2016-12-16 ENCOUNTER — Ambulatory Visit (HOSPITAL_COMMUNITY)
Admission: RE | Admit: 2016-12-16 | Discharge: 2016-12-16 | Disposition: A | Discharge status: Home / Self Care | Payer: 59 | Source: Ambulatory Visit | Attending: Obstetrics & Gynecology | Admitting: Obstetrics & Gynecology

## 2016-12-16 ENCOUNTER — Other Ambulatory Visit: Payer: Self-pay | Admitting: Obstetrics & Gynecology

## 2016-12-16 ENCOUNTER — Encounter (HOSPITAL_COMMUNITY): Payer: Self-pay

## 2016-12-16 DIAGNOSIS — O99211 Obesity complicating pregnancy, first trimester: Secondary | ICD-10-CM

## 2016-12-16 DIAGNOSIS — O281 Abnormal biochemical finding on antenatal screening of mother: Secondary | ICD-10-CM | POA: Diagnosis not present

## 2016-12-16 DIAGNOSIS — Z3682 Encounter for antenatal screening for nuchal translucency: Secondary | ICD-10-CM

## 2016-12-16 DIAGNOSIS — Z3A13 13 weeks gestation of pregnancy: Secondary | ICD-10-CM

## 2016-12-16 DIAGNOSIS — Z3400 Encounter for supervision of normal first pregnancy, unspecified trimester: Secondary | ICD-10-CM

## 2016-12-17 ENCOUNTER — Ambulatory Visit (HOSPITAL_COMMUNITY): Payer: 59

## 2016-12-24 ENCOUNTER — Inpatient Hospital Stay (HOSPITAL_COMMUNITY)
Admission: AD | Admit: 2016-12-24 | Discharge: 2016-12-25 | Disposition: A | Discharge status: Home / Self Care | Payer: 59 | Source: Ambulatory Visit | Attending: Obstetrics & Gynecology | Admitting: Obstetrics & Gynecology

## 2016-12-24 DIAGNOSIS — E669 Obesity, unspecified: Secondary | ICD-10-CM | POA: Insufficient documentation

## 2016-12-24 DIAGNOSIS — O99212 Obesity complicating pregnancy, second trimester: Secondary | ICD-10-CM | POA: Insufficient documentation

## 2016-12-24 DIAGNOSIS — J029 Acute pharyngitis, unspecified: Secondary | ICD-10-CM | POA: Insufficient documentation

## 2016-12-24 DIAGNOSIS — O9989 Other specified diseases and conditions complicating pregnancy, childbirth and the puerperium: Secondary | ICD-10-CM

## 2016-12-24 DIAGNOSIS — O99210 Obesity complicating pregnancy, unspecified trimester: Secondary | ICD-10-CM

## 2016-12-24 DIAGNOSIS — Z87891 Personal history of nicotine dependence: Secondary | ICD-10-CM | POA: Diagnosis not present

## 2016-12-24 DIAGNOSIS — Z3A14 14 weeks gestation of pregnancy: Secondary | ICD-10-CM | POA: Diagnosis not present

## 2016-12-24 DIAGNOSIS — Z2839 Other underimmunization status: Secondary | ICD-10-CM

## 2016-12-24 DIAGNOSIS — Z283 Underimmunization status: Secondary | ICD-10-CM

## 2016-12-24 DIAGNOSIS — O09899 Supervision of other high risk pregnancies, unspecified trimester: Secondary | ICD-10-CM

## 2016-12-24 DIAGNOSIS — O99512 Diseases of the respiratory system complicating pregnancy, second trimester: Secondary | ICD-10-CM | POA: Insufficient documentation

## 2016-12-24 LAB — URINALYSIS, ROUTINE W REFLEX MICROSCOPIC
BILIRUBIN URINE: NEGATIVE
Glucose, UA: NEGATIVE mg/dL
Hgb urine dipstick: NEGATIVE
KETONES UR: 20 mg/dL — AB
LEUKOCYTES UA: NEGATIVE
NITRITE: NEGATIVE
PH: 5 (ref 5.0–8.0)
PROTEIN: NEGATIVE mg/dL
Specific Gravity, Urine: 1.019 (ref 1.005–1.030)

## 2016-12-24 NOTE — MAU Note (Addendum)
Sore throat since Thurs. Tonsils swollen. Hard to swallow and eat. Want to be sure baby ok. Feel cold and weak and just want to sleep. Occ pain in lower abd that thinks is round ligament and occ low back pain. Only pain currently is throat

## 2016-12-24 NOTE — MAU Note (Addendum)
Judeth HornERin Lawrence NP in Triage to talk with pt.

## 2016-12-24 NOTE — MAU Provider Note (Signed)
History     CSN: [id number]  Arrival date and time: 12/24/16 2244  First Provider Initiated Contact with Patient 12/24/16 2353      Chief Complaint  Patient presents with  . Sore Throat   Kaitlin Jackson is a 26 y.o. G2P0010 at [redacted]w[redacted]d who presents with sore throat. Has history of strep throat, most recently last year. States current pain feels like previous strep.    Sore Throat   This is a new problem. The current episode started yesterday. The problem has been gradually worsening. There has been no fever. The pain is at a severity of 10/10. Associated symptoms include congestion, a hoarse voice, swollen glands and trouble swallowing. Pertinent negatives include no abdominal pain, coughing, drooling, ear pain, headaches, neck pain, shortness of breath or vomiting. She has had no exposure to strep or mono. She has tried nothing for the symptoms.   OB History    Gravida Para Term Preterm AB Living   2       1     SAB TAB Ectopic Multiple Live Births   1              Past Medical History:  Diagnosis Date  . Abnormal Pap smear   . Chlamydia   . Kidney stones   . Obesity   . Renal disorder   . Vaginal Pap smear, abnormal     Past Surgical History:  Procedure Laterality Date  . NO PAST SURGERIES      Family History  Problem Relation Age of Onset  . Miscarriages / [place] Mother   . Hypertension Mother   . Asthma Brother   . Diabetes Paternal Grandmother     Social History  Substance Use Topics  . Smoking status: Former Smoker    Packs/day: 0.50    Types: Cigarettes  . Smokeless tobacco: Never Used  . Alcohol use No    Allergies: No Known Allergies  Prescriptions Prior to Admission  Medication Sig Dispense Refill Last Dose  . metoCLOPramide (REGLAN) 10 MG tablet Take 1 tablet (10 mg total) by mouth 4 (four) times daily as needed for nausea or vomiting. (Patient not taking: Reported on 12/16/2016) 30 tablet 2 Not Taking  . ondansetron (ZOFRAN ODT) 4 MG  disintegrating tablet Take 1 tablet (4 mg total) by mouth every 6 (six) hours as needed for nausea. (Patient not taking: Reported on 12/16/2016) 20 tablet 0 Not Taking  . Prenatal Vit-Fe Fumarate-FA (PREPLUS) 27-1 MG TABS Take 1 tablet by mouth daily. 30 tablet 13 Taking  . terconazole (TERAZOL 7) 0.4 % vaginal cream Place 1 applicator vaginally at bedtime. Use for seven days (Patient not taking: Reported on 12/16/2016) 45 g 0 Not Taking    Review of Systems  Constitutional: Negative for chills and fever.  HENT: Positive for congestion, hoarse voice, postnasal drip, sore throat and trouble swallowing. Negative for drooling, ear pain and sinus pain.   Respiratory: Negative for cough and shortness of breath.   Gastrointestinal: Negative.  Negative for abdominal pain and vomiting.  Genitourinary: Negative.   Musculoskeletal: Negative for neck pain.  Neurological: Negative for headaches.   Physical Exam   Blood pressure 125/60, pulse 91, temperature 98.9 F (37.2 C), resp. rate 18, height 5\' 5"  (1.651 m), weight 293 lb (132.9 kg), last menstrual period 09/13/2016.  Physical Exam  Nursing note and vitals reviewed. Constitutional: She is oriented to person, place, and time. She appears well-developed and well-nourished. No distress.  HENT:  Head:  Normocephalic and atraumatic.  Right Ear: Tympanic membrane normal.  Left Ear: Tympanic membrane normal.  Nose: Mucosal edema and rhinorrhea present.  Mouth/Throat: Uvula is midline. Oropharyngeal exudate, posterior oropharyngeal edema and posterior oropharyngeal erythema present. No tonsillar abscesses.  Eyes: Conjunctivae are normal. Right eye exhibits no discharge. Left eye exhibits no discharge. No scleral icterus.  Neck: Normal range of motion.  Cardiovascular: Normal rate, regular rhythm and normal heart sounds.   No murmur heard. Respiratory: Effort normal and breath sounds normal. No respiratory distress. She has no wheezes.  Lymphadenopathy:        Head (right side): Submandibular adenopathy present.       Head (left side): Submandibular adenopathy present.  Neurological: She is alert and oriented to person, place, and time.  Skin: Skin is warm and dry. She is not diaphoretic.  Psychiatric: She has a normal mood and affect. Her behavior is normal. Judgment and thought content normal.    MAU Course  Procedures Results for orders placed or performed during the hospital encounter of 12/24/16 (from the past 24 hour(s))  Urinalysis, Routine w reflex microscopic     Status: Abnormal   Collection Time: 12/24/16 11:05 PM  Result Value Ref Range   Color, Urine YELLOW YELLOW   APPearance HAZY (A) CLEAR   Specific Gravity, Urine 1.019 1.005 - 1.030   pH 5.0 5.0 - 8.0   Glucose, UA NEGATIVE NEGATIVE mg/dL   Hgb urine dipstick NEGATIVE NEGATIVE   Bilirubin Urine NEGATIVE NEGATIVE   Ketones, ur 20 (A) NEGATIVE mg/dL   Protein, ur NEGATIVE NEGATIVE mg/dL   Nitrite NEGATIVE NEGATIVE   Leukocytes, UA NEGATIVE NEGATIVE    MDM FHT 159 by doppler Strep swab collected VSS, NAD  Assessment and Plan  A: 1. Acute pharyngitis, unspecified etiology   2. Rubella non-immune status, antepartum   3. Obesity in pregnancy, antepartum    P: Discharge home Rx pencillin V 500mg  BID x 10 days (liquid) OTC meds safe in pregnancy -- discussed OTC tx of throat pain Discussed reasons to return to MAU Keep follow up appointment with OB/PCP  Strep swab pending   [name] 12/24/2016, 11:53 PM

## 2016-12-25 DIAGNOSIS — J029 Acute pharyngitis, unspecified: Secondary | ICD-10-CM

## 2016-12-25 DIAGNOSIS — O99512 Diseases of the respiratory system complicating pregnancy, second trimester: Secondary | ICD-10-CM | POA: Diagnosis not present

## 2016-12-25 DIAGNOSIS — Z87891 Personal history of nicotine dependence: Secondary | ICD-10-CM | POA: Diagnosis not present

## 2016-12-25 DIAGNOSIS — O99212 Obesity complicating pregnancy, second trimester: Secondary | ICD-10-CM | POA: Diagnosis not present

## 2016-12-25 DIAGNOSIS — E669 Obesity, unspecified: Secondary | ICD-10-CM | POA: Diagnosis not present

## 2016-12-25 DIAGNOSIS — Z3A14 14 weeks gestation of pregnancy: Secondary | ICD-10-CM | POA: Diagnosis not present

## 2016-12-25 LAB — RAPID STREP SCREEN (MED CTR MEBANE ONLY): Streptococcus, Group A Screen (Direct): POSITIVE — AB

## 2016-12-25 MED ORDER — PENICILLIN V POTASSIUM 250 MG/5ML PO SOLR: 500.0000 mg | Freq: Two times a day (BID) | ORAL | 0 refills | Status: AC

## 2016-12-25 NOTE — MAU Note (Signed)
Judeth HornERin Lawrence NP in to discuss antibiotic with pt and then pt d/c home from Triage

## 2016-12-25 NOTE — Discharge Instructions (Signed)

## 2017-01-19 ENCOUNTER — Other Ambulatory Visit: Payer: Self-pay | Admitting: Obstetrics & Gynecology

## 2017-01-19 ENCOUNTER — Ambulatory Visit (HOSPITAL_COMMUNITY)
Admission: RE | Admit: 2017-01-19 | Discharge: 2017-01-19 | Disposition: A | Discharge status: Home / Self Care | Payer: 59 | Source: Ambulatory Visit | Attending: Obstetrics & Gynecology | Admitting: Obstetrics & Gynecology

## 2017-01-19 ENCOUNTER — Telehealth: Payer: Self-pay

## 2017-01-19 DIAGNOSIS — Z3689 Encounter for other specified antenatal screening: Secondary | ICD-10-CM

## 2017-01-19 DIAGNOSIS — Z3A18 18 weeks gestation of pregnancy: Secondary | ICD-10-CM

## 2017-01-19 DIAGNOSIS — O99212 Obesity complicating pregnancy, second trimester: Secondary | ICD-10-CM | POA: Insufficient documentation

## 2017-01-19 DIAGNOSIS — Z363 Encounter for antenatal screening for malformations: Secondary | ICD-10-CM

## 2017-01-19 DIAGNOSIS — Z3400 Encounter for supervision of normal first pregnancy, unspecified trimester: Secondary | ICD-10-CM

## 2017-01-19 NOTE — Telephone Encounter (Signed)
LM for pt that we have received FMLA paperwork and we need to request that she comes to the office to fill out a ROI and that there is $15 fee for the paperwork to be filled out.  I also stated in message if she could please let us know if she needs FMLA for after delivery or intermittent for appts and etc. If she could please give the office a call.

## 2017-01-27 NOTE — Telephone Encounter (Signed)
Called and left message for patient to call us back concerning paperwork she dropped off at our office

## 2017-01-31 ENCOUNTER — Ambulatory Visit (INDEPENDENT_AMBULATORY_CARE_PROVIDER_SITE_OTHER): Payer: 59 | Admitting: Certified Nurse Midwife

## 2017-01-31 VITALS — BP 122/78 | HR 75 | Wt 299.3 lb

## 2017-01-31 DIAGNOSIS — Z3400 Encounter for supervision of normal first pregnancy, unspecified trimester: Secondary | ICD-10-CM

## 2017-01-31 DIAGNOSIS — O99210 Obesity complicating pregnancy, unspecified trimester: Secondary | ICD-10-CM

## 2017-01-31 DIAGNOSIS — F1290 Cannabis use, unspecified, uncomplicated: Secondary | ICD-10-CM

## 2017-01-31 DIAGNOSIS — O99213 Obesity complicating pregnancy, third trimester: Secondary | ICD-10-CM

## 2017-01-31 DIAGNOSIS — Z3493 Encounter for supervision of normal pregnancy, unspecified, third trimester: Secondary | ICD-10-CM

## 2017-01-31 DIAGNOSIS — R609 Edema, unspecified: Secondary | ICD-10-CM

## 2017-01-31 NOTE — Progress Notes (Signed)
Subjective:  Kaitlin Jackson is a 26 y.o. G2P0010 at [redacted]w[redacted]d being seen today for ongoing prenatal care.  She is currently monitored for the following issues for this low-risk pregnancy and has Supervision of normal pregnancy; Obesity in pregnancy, antepartum; Rubella non-immune status, antepartum; and Marijuana use on her problem list.  Patient reports no complaints.  Contractions: Not present. Vag. Bleeding: None.  Movement: Present. Denies leaking of fluid.   The following portions of the patient's history were reviewed and updated as appropriate: allergies, current medications, past family history, past medical history, past social history, past surgical history and problem list. Problem list updated.  Objective:   Vitals:   01/31/17 0815  BP: 122/78  Pulse: 75  Weight: 299 lb 4.8 oz (135.8 kg)    Fetal Status: Fetal Heart Rate (bpm): 140 Fundal Height: 20 cm Movement: Present     General:  Alert, oriented and cooperative. Patient is in no acute distress.  Skin: Skin is warm and dry. No rash noted.   Cardiovascular: Normal heart rate noted  Respiratory: Normal respiratory effort, no problems with respiration noted  Abdomen: Soft, gravid, appropriate for gestational age. Pain/Pressure: Absent     Pelvic: Vag. Bleeding: None     Cervical exam deferred        Extremities: Normal range of motion.  Edema: Trace R>L, nml pulse, no erythema or deformity  Mental Status: Normal mood and affect. Normal behavior. Normal judgment and thought content.   Urinalysis:      Assessment and Plan:  Pregnancy: G2P0010 at [redacted]w[redacted]d  1. Supervision of normal first pregnancy, antepartum  - [place] MFM OB FOLLOW UP; Future-to complete anatomy  2. Marijuana use - no use since January  3. Obesity in pregnancy, antepartum - discussed good nutrition and daily exercise - weight gain goals  4. Dependent edema - no signs of DVT - Maternity compression stockings- Rx given  Preterm labor symptoms and  general obstetric precautions including but not limited to vaginal bleeding, contractions, leaking of fluid and fetal movement were reviewed in detail with the patient. Please refer to After Visit Summary for other counseling recommendations.  Return in about 4 days (around 02/04/2017). for AFP and early GTT   Donette Larry, CNM

## 2017-01-31 NOTE — Progress Notes (Signed)
c/o edema in right lower leg at end of day more than left. Has not been taking weight and blood pressure for baby [employer]- emphasized importance of doing this.

## 2017-01-31 NOTE — Patient Instructions (Signed)
AREA PEDIATRIC/FAMILY PRACTICE PHYSICIANS  [facility] [address], [address] [place], [place]  [address] Phone - [phone]   Fax - [phone]  [facility] [address] [place], [place] [address] Phone - [phone]   Fax - [phone]  [facility] [address] [place], [place]  [address] Phone - [phone]   Fax - [phone]  [facility] [address] [place], [place]  [address] Phone - [phone]   Fax - [phone]  [facility] [address] [place], [place]  [address] Phone - [phone]   Fax - [phone]  [facility] [address] [place], [place]  [address] Phone - [phone]   Fax - [phone]  [facility] [address] [place], [place]  [address] Phone - [phone]   Fax - [phone]  [facility] [address], [address] [place], [place]  [address] Phone - [phone]   Fax - [phone]  [facility] [address] [place], [place]  [address] Phone - [phone]   Fax - [phone] [facility] [address] [place], [place]  [address] Phone - [phone]   Fax - [phone]  [facility] [address] [place], [place]  [address] Phone - [phone]   Fax - [phone]  [facility] [address] [place], [place]  [address] Phone - [phone]   Fax - [phone]  [facility] [address], [address] [place], [place]  [address] Phone - [phone]   Fax - [phone]  SHILPA GOSRANI [address] [place], [place]  [address] Phone - [phone]  [facility] [address] [place], [place]  [address] Phone - [phone]   Fax - [phone]  [facility] [address] [place], [place]  [address] Phone - [phone]   Fax - [phone]  [facility] [address] [place], [place]  [address] Phone - [phone]   Fax - [phone]  [facility] [address] [place], [place]  [address] Phone - [phone]   Fax - [phone]   [facility] [address] [place], [place]  [address] Phone - [phone]   Fax - [phone]  [facility] [address] [place], [place]  [address] Phone - [phone]   Fax - [phone]  DAVID RUBIN [address] [place], [place]  [address] Phone - [phone]   Fax - [phone]  [facility] [address] , [place]  [address] Phone - [phone]   Fax - [phone]  [facility] [address] , [place]  [address] Phone - [phone]   Fax - [phone] [facility] [address] [place], [place]  [address] Phone - [phone]   Fax - [phone]  [facility] [address] [place], [place]  [address] Phone - [phone]   Fax - [phone]   [facility] [address] [place], [place]  [address] Phone - [phone]   Fax - [phone]  [facility] Charlene Flemming MD [address] [place]  [address] Phone [phone]  Fax [phone]   

## 2017-01-31 NOTE — Progress Notes (Signed)
Iyauna non compliant with [employer] - not gathering data and submitting- is no longer optimized.

## 2017-02-03 ENCOUNTER — Other Ambulatory Visit: Payer: 59

## 2017-02-03 DIAGNOSIS — Z3482 Encounter for supervision of other normal pregnancy, second trimester: Secondary | ICD-10-CM

## 2017-02-03 DIAGNOSIS — O99210 Obesity complicating pregnancy, unspecified trimester: Secondary | ICD-10-CM

## 2017-02-04 LAB — GLUCOSE TOLERANCE, 2 HOURS W/ 1HR
GLUCOSE, 2 HOUR: 77 mg/dL (ref 65–152)
Glucose, 1 hour: 95 mg/dL (ref 65–179)
Glucose, Fasting: 79 mg/dL (ref 65–91)

## 2017-02-08 LAB — AFP, SERUM, OPEN SPINA BIFIDA
AFP MOM: 0.79
AFP VALUE AFPOSL: 34.7 ng/mL
Gest. Age on Collection Date: 20.4 weeks
Maternal Age At EDD: 28.4 yr
OSBR Risk 1 IN: 10000
Test Results:: NEGATIVE
WEIGHT: 299 [lb_av]

## 2017-02-23 ENCOUNTER — Ambulatory Visit (HOSPITAL_COMMUNITY)
Admission: RE | Admit: 2017-02-23 | Discharge: 2017-02-23 | Disposition: A | Discharge status: Home / Self Care | Payer: 59 | Source: Ambulatory Visit | Attending: Certified Nurse Midwife | Admitting: Certified Nurse Midwife

## 2017-02-23 DIAGNOSIS — O99210 Obesity complicating pregnancy, unspecified trimester: Secondary | ICD-10-CM

## 2017-02-23 DIAGNOSIS — Z362 Encounter for other antenatal screening follow-up: Secondary | ICD-10-CM | POA: Diagnosis not present

## 2017-02-23 DIAGNOSIS — O99212 Obesity complicating pregnancy, second trimester: Secondary | ICD-10-CM | POA: Diagnosis not present

## 2017-02-23 DIAGNOSIS — Z3689 Encounter for other specified antenatal screening: Secondary | ICD-10-CM | POA: Diagnosis not present

## 2017-02-23 DIAGNOSIS — Z3400 Encounter for supervision of normal first pregnancy, unspecified trimester: Secondary | ICD-10-CM

## 2017-02-23 DIAGNOSIS — Z3A23 23 weeks gestation of pregnancy: Secondary | ICD-10-CM | POA: Insufficient documentation

## 2017-03-02 ENCOUNTER — Ambulatory Visit (INDEPENDENT_AMBULATORY_CARE_PROVIDER_SITE_OTHER): Payer: 59 | Admitting: Certified Nurse Midwife

## 2017-03-02 DIAGNOSIS — O99210 Obesity complicating pregnancy, unspecified trimester: Secondary | ICD-10-CM

## 2017-03-02 DIAGNOSIS — Z3402 Encounter for supervision of normal first pregnancy, second trimester: Secondary | ICD-10-CM

## 2017-03-02 DIAGNOSIS — O99212 Obesity complicating pregnancy, second trimester: Secondary | ICD-10-CM

## 2017-03-02 DIAGNOSIS — E669 Obesity, unspecified: Secondary | ICD-10-CM

## 2017-03-02 DIAGNOSIS — Z3482 Encounter for supervision of other normal pregnancy, second trimester: Secondary | ICD-10-CM

## 2017-03-02 NOTE — Progress Notes (Signed)
Subjective:  Kaitlin Jackson is a 26 y.o. G2P0010 at 1812w2d being seen today for ongoing prenatal care.  She is currently monitored for the following issues for this low-risk pregnancy and has Supervision of normal pregnancy; Obesity in pregnancy, antepartum; Rubella non-immune status, antepartum; and Marijuana use on her problem list.  Patient reports RL pain with sudden movements and position changes; thin white nonodorous vaginal discharge w/o itching.  Contractions: Not present. Vag. Bleeding: None.  Movement: Present. Denies leaking of fluid.   The following portions of the patient's history were reviewed and updated as appropriate: allergies, current medications, past family history, past medical history, past social history, past surgical history and problem list. Problem list updated.  Objective:   Vitals:   03/02/17 0846  BP: 114/72  Pulse: 79  Weight: 298 lb 11.2 oz (135.5 kg)    Fetal Status: Fetal Heart Rate (bpm): 140   Movement: Present     General:  Alert, oriented and cooperative. Patient is in no acute distress.  Skin: Skin is warm and dry. No rash noted.   Cardiovascular: Normal heart rate noted  Respiratory: Normal respiratory effort, no problems with respiration noted  Abdomen: Soft, gravid, appropriate for gestational age. Pain/Pressure: Present     Pelvic: Vag. Bleeding: None Vag D/C Character: White   Cervical exam deferred        Extremities: Normal range of motion.  Edema: Trace  Mental Status: Normal mood and affect. Normal behavior. Normal judgment and thought content.   Urinalysis:      Assessment and Plan:  Pregnancy: G2P0010 at 6112w2d  1. Encounter for supervision of normal first pregnancy in second trimester - discussed comfort measures for RL pain - vaginal discharge likely physiologic to pregnancy - GTT and labs next visit  2. Obesity in pregnancy - nml early GTT - continue good nutrition - weight stable - consider growth US in 3rd trimester  as FH may be unreliable  Preterm labor symptoms and general obstetric precautions including but not limited to vaginal bleeding, contractions, leaking of fluid and fetal movement were reviewed in detail with the patient. Please refer to After Visit Summary for other counseling recommendations.  Return in about 4 weeks (around 03/30/2017).   Donette LarryBhambri, Kaitlin Jackson, CNM

## 2017-03-23 IMAGING — US US OB TRANSVAGINAL
1 series · 15 of 28 positions shown · non-contrast
Comparison: 10/14/2016 obstetric scan.

CLINICAL DATA: 25-year-old pregnant female presents with abdominal
pain. Intrauterine gestational sac with yolk sac and no embryo on
obstetric scan performed 1 week prior.

EDC by LMP: 06/20/2017, projecting to an expected gestational age of
5 weeks 3 days.
EXAM:
TRANSVAGINAL OB ULTRASOUND
TECHNIQUE: Transvaginal ultrasound was performed for complete evaluation of the
gestation as well as the maternal uterus, adnexal regions, and
pelvic cul-de-sac.

[Series 1: us ob transvaginal · 42 acquisitions, 15 frames shown]
[im 1/42]
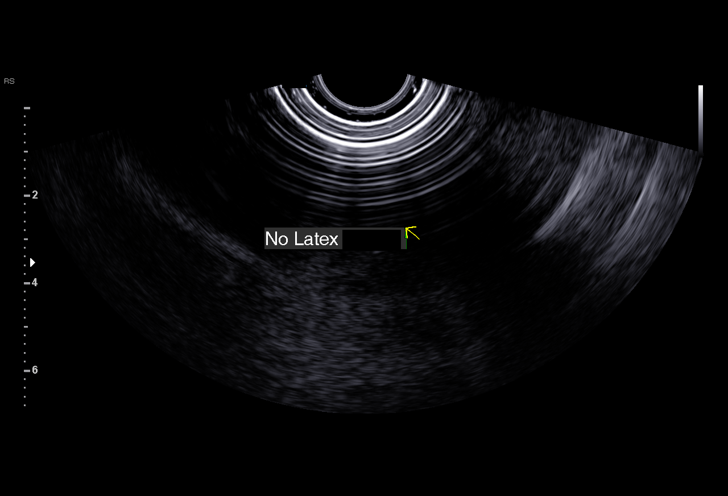
[im 4/42]
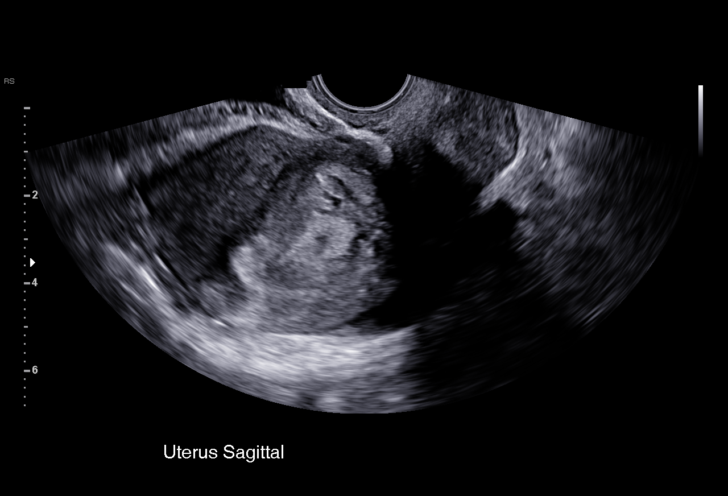
[im 7/42]
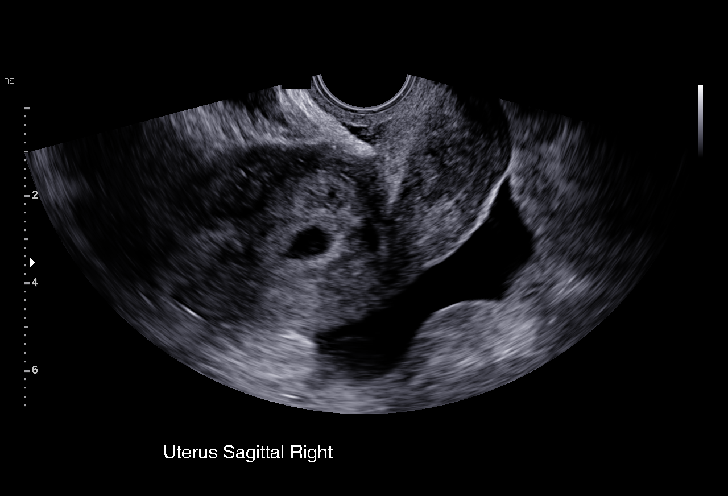
[im 10/42]
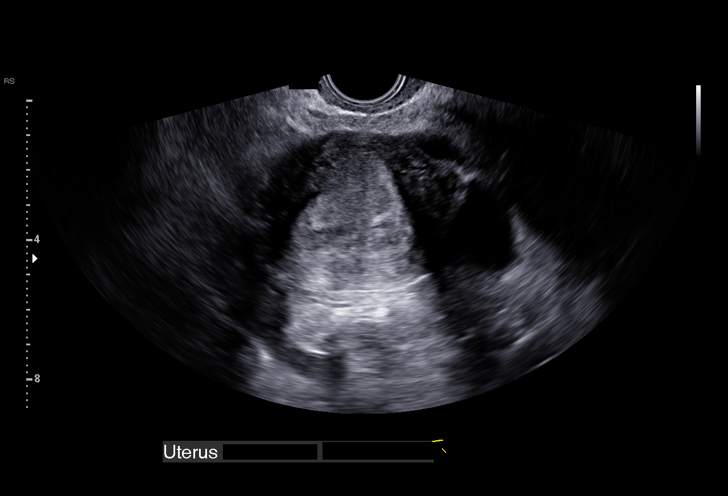
[im 13/42]
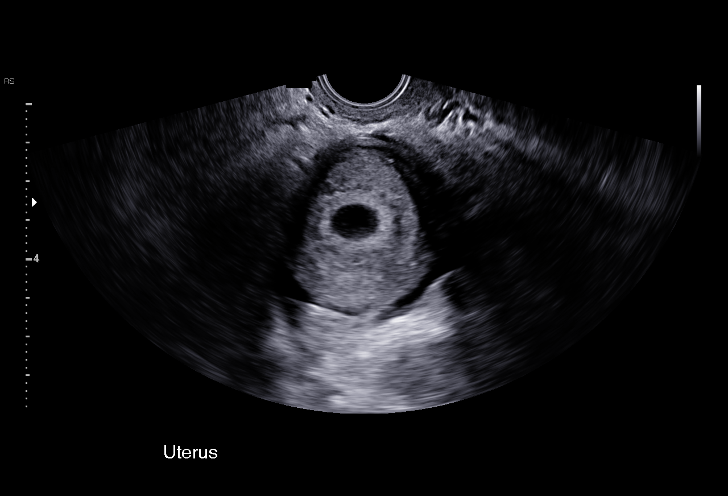
[im 16/42]
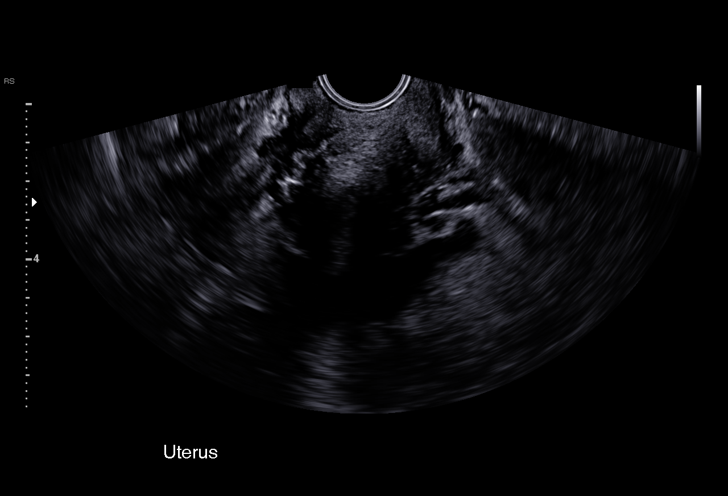
[im 19/42]
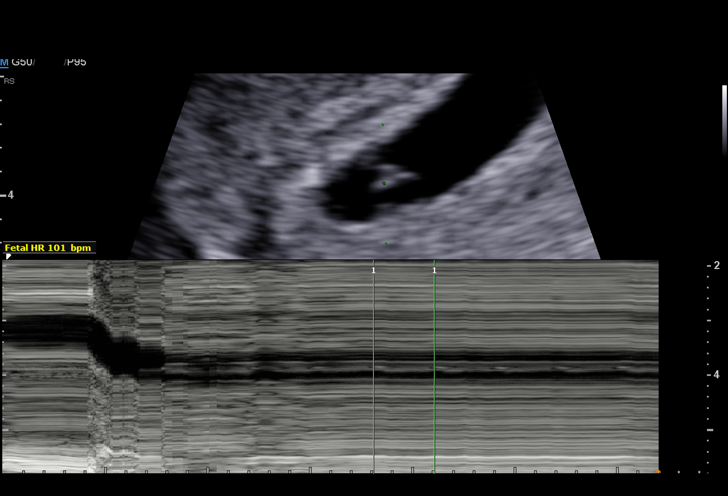
[im 22/42]
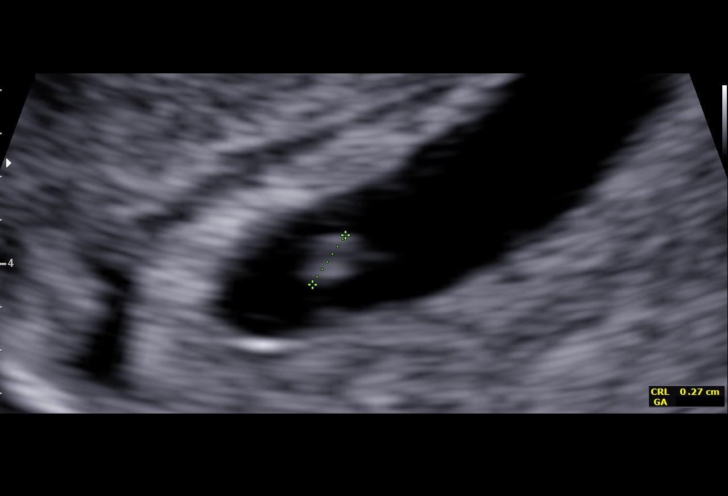
[im 23/42]
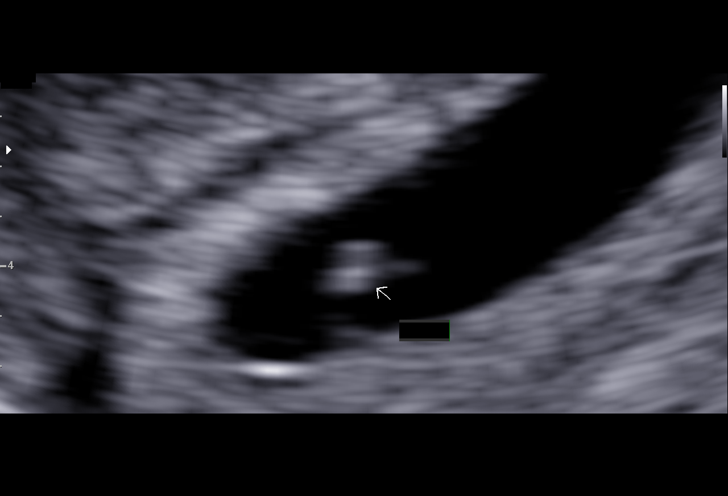
[im 26/42]
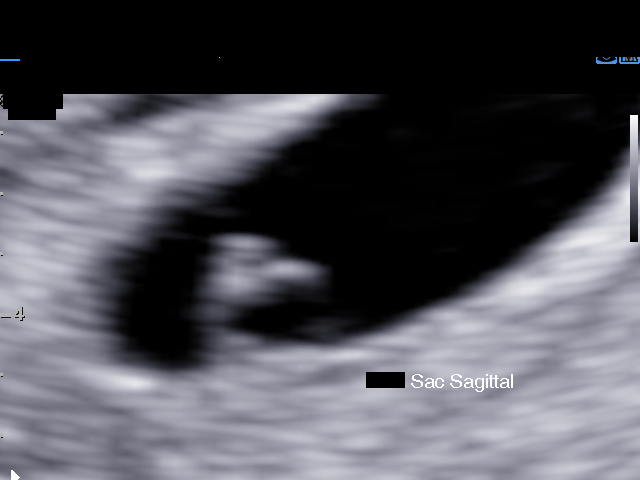
[im 29/42]
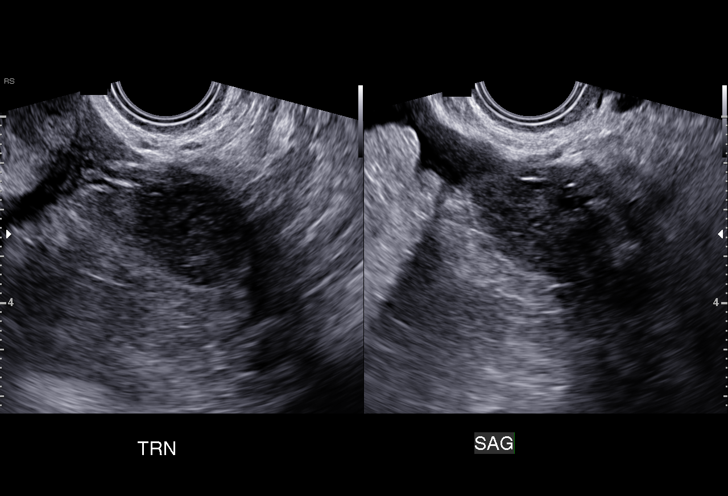
[im 32/42]
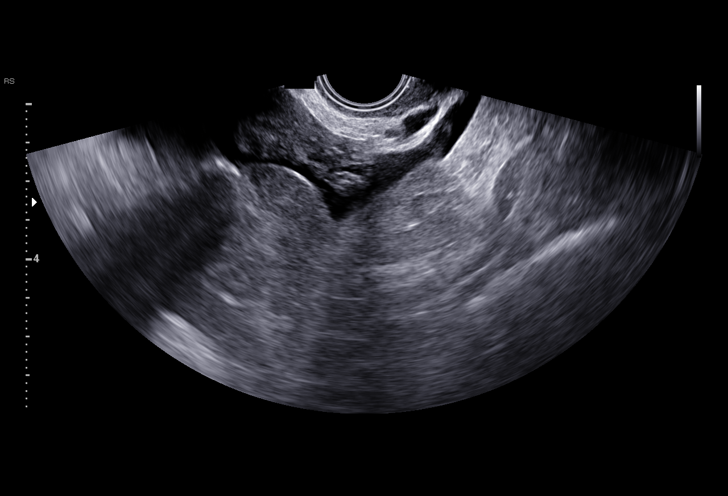
[im 35/42]
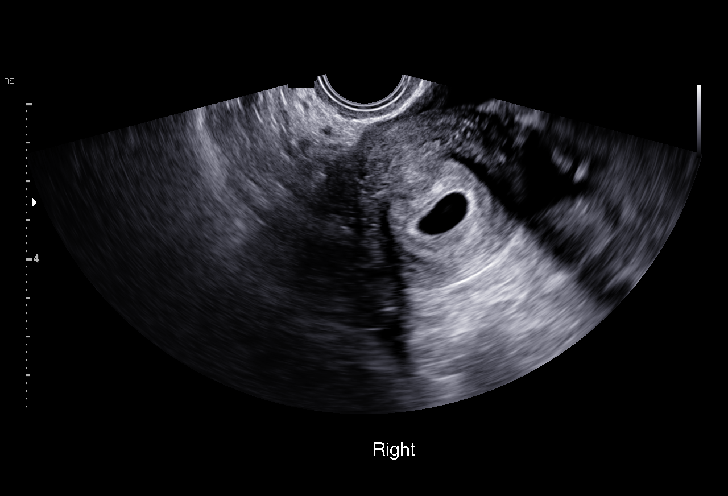
[im 38/42]
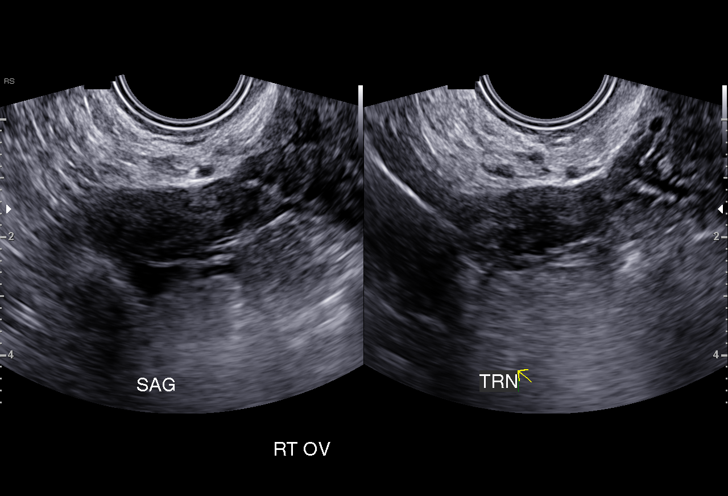
[im 42/42]
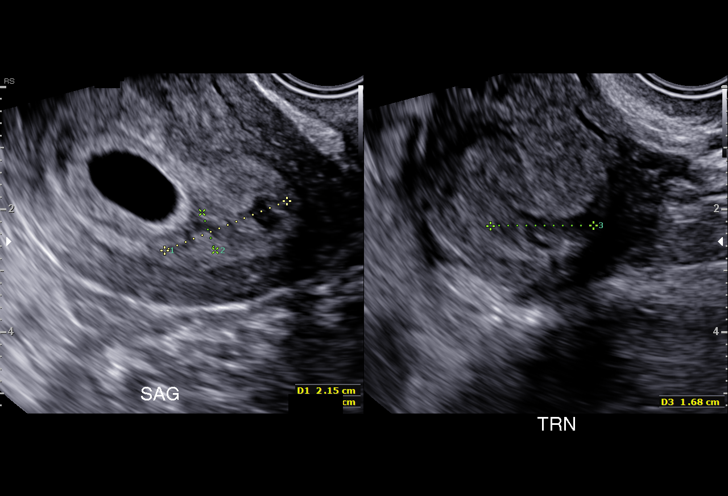

[15 of 28 positions shown; findings below may reference images not displayed]

FINDINGS: Intrauterine gestational sac: Single intrauterine gestational sac
appears normal in size, shape and position.

Yolk sac:  Visualized.

Embryo:  Visualized.

Embryonic Cardiac Activity: Visualized.

Embryonic Heart Rate: 101 bpm

CRL:   2.5  mm   5 w 5 d                  US EDC: 06/18/2017

Subchorionic hemorrhage: Small perigestational bleed in the lower
cavity involving less than 30% of the gestational sac circumference,
measuring 2.2 x 0.7 x 1.7 cm.

Maternal uterus/adnexae: Nonspecific small volume simple free fluid
in the pelvic cul-de-sac. Left ovary measures 3.5 x 2.4 x 2.5 cm.
Right ovary measures 2.9 x 1.3 x 2.0 cm. No suspicious ovarian or
adnexal masses. No uterine fibroids are demonstrated.
IMPRESSION: 1. Single living intrauterine gestation at 5 weeks 5 days by
crown-rump length, with no significant discrepancy with the expected
gestational age of 5 weeks 3 days by provided menstrual dating.
2. Embryonic heart rate 101 bpm, within normal limits for this early
gestational age.
3. Small perigestational bleed.
4. Nonspecific small volume simple free fluid in pelvic cul-de-sac.
5. No suspicious ovarian or adnexal masses.

## 2017-03-30 ENCOUNTER — Ambulatory Visit (INDEPENDENT_AMBULATORY_CARE_PROVIDER_SITE_OTHER): Payer: 59 | Admitting: Advanced Practice Midwife

## 2017-03-30 ENCOUNTER — Encounter: Payer: Self-pay | Admitting: General Practice

## 2017-03-30 ENCOUNTER — Encounter: Payer: Self-pay | Admitting: Advanced Practice Midwife

## 2017-03-30 VITALS — BP 126/69 | HR 79 | Wt 310.1 lb

## 2017-03-30 DIAGNOSIS — Z3480 Encounter for supervision of other normal pregnancy, unspecified trimester: Secondary | ICD-10-CM

## 2017-03-30 DIAGNOSIS — Z029 Encounter for administrative examinations, unspecified: Secondary | ICD-10-CM

## 2017-03-30 DIAGNOSIS — Z3483 Encounter for supervision of other normal pregnancy, third trimester: Secondary | ICD-10-CM

## 2017-03-30 NOTE — Progress Notes (Signed)
   PRENATAL VISIT NOTE  Subjective:  Kaitlin Jackson is a 26 y.o. G2P0010 at 143w2d being seen today for ongoing prenatal care.  She is currently monitored for the following issues for this low-risk pregnancy and has Supervision of normal pregnancy; Obesity in pregnancy, antepartum; Rubella non-immune status, antepartum; and Marijuana use on her problem list.  Patient reports no complaints.  Contractions: Not present. Vag. Bleeding: None.  Movement: Present. Denies leaking of fluid.   The following portions of the patient's history were reviewed and updated as appropriate: allergies, current medications, past family history, past medical history, past social history, past surgical history and problem list. Problem list updated.  Objective:   Vitals:   03/30/17 0750  BP: 126/69  Pulse: 79  Weight: (!) 310 lb 1.6 oz (140.7 kg)    Fetal Status: Fetal Heart Rate (bpm): 140 Fundal Height: 35 cm Movement: Present     General:  Alert, oriented and cooperative. Patient is in no acute distress.  Skin: Skin is warm and dry. No rash noted.   Cardiovascular: Normal heart rate noted  Respiratory: Normal respiratory effort, no problems with respiration noted  Abdomen: Soft, gravid, appropriate for gestational age. Pain/Pressure: Present     Pelvic:  Cervical exam deferred        Extremities: Normal range of motion.  Edema: Trace  Mental Status: Normal mood and affect. Normal behavior. Normal judgment and thought content.   Assessment and Plan:  Pregnancy: G2P0010 at 6543w2d  1. Supervision of other normal pregnancy, antepartum      Discussed concerns that wearing pants under belly will hurt baby, and sitting up / muscles flexing will hurt baby.  Reviewed fetal movement and development.       My fundal height is measuring much bigger than last ones.  Suspect this is due to pt's habitus  - Glucose Tolerance, 2 Hours w/1 Hour - CBC - RPR - HIV antibody - Tdap vaccine greater than or equal to  7yo IM  Preterm labor symptoms and general obstetric precautions including but not limited to vaginal bleeding, contractions, leaking of fluid and fetal movement were reviewed in detail with the patient. Please refer to After Visit Summary for other counseling recommendations.  Return in about 2 weeks (around 04/13/2017).   Wynelle BourgeoisMarie Alyia Lacerte, CNM

## 2017-03-30 NOTE — Patient Instructions (Signed)
Third Trimester of Pregnancy The third trimester is from week 28 through week 40 (months 7 through 9). The third trimester is a time when the unborn baby (fetus) is growing rapidly. At the end of the ninth month, the fetus is about 20 inches in length and weighs 6-10 pounds. Body changes during your third trimester Your body will continue to go through many changes during pregnancy. The changes vary from woman to woman. During the third trimester:  Your weight will continue to increase. You can expect to gain 25-35 pounds (11-16 kg) by the end of the pregnancy.  You may begin to get stretch marks on your hips, abdomen, and breasts.  You may urinate more often because the fetus is moving lower into your pelvis and pressing on your bladder.  You may develop or continue to have heartburn. This is caused by increased hormones that slow down muscles in the digestive tract.  You may develop or continue to have constipation because increased hormones slow digestion and cause the muscles that push waste through your intestines to relax.  You may develop hemorrhoids. These are swollen veins (varicose veins) in the rectum that can itch or be painful.  You may develop swollen, bulging veins (varicose veins) in your legs.  You may have increased body aches in the pelvis, back, or thighs. This is due to weight gain and increased hormones that are relaxing your joints.  You may have changes in your hair. These can include thickening of your hair, rapid growth, and changes in texture. Some women also have hair loss during or after pregnancy, or hair that feels dry or thin. Your hair will most likely return to normal after your baby is born.  Your breasts will continue to grow and they will continue to become tender. A yellow fluid (colostrum) may leak from your breasts. This is the first milk you are producing for your baby.  Your belly button may stick out.  You may notice more swelling in your hands,  face, or ankles.  You may have increased tingling or numbness in your hands, arms, and legs. The skin on your belly may also feel numb.  You may feel short of breath because of your expanding uterus.  You may have more problems sleeping. This can be caused by the size of your belly, increased need to urinate, and an increase in your body's metabolism.  You may notice the fetus "dropping," or moving lower in your abdomen (lightening).  You may have increased vaginal discharge.  You may notice your joints feel loose and you may have pain around your pelvic bone.  What to expect at prenatal visits You will have prenatal exams every 2 weeks until week 36. Then you will have weekly prenatal exams. During a routine prenatal visit:  You will be weighed to make sure you and the baby are growing normally.  Your blood pressure will be taken.  Your abdomen will be measured to track your baby's growth.  The fetal heartbeat will be listened to.  Any test results from the previous visit will be discussed.  You may have a cervical check near your due date to see if your cervix has softened or thinned (effaced).  You will be tested for Group B streptococcus. This happens between 35 and 37 weeks.  Your health care provider may ask you:  What your birth plan is.  How you are feeling.  If you are feeling the baby move.  If you have had   any abnormal symptoms, such as leaking fluid, bleeding, severe headaches, or abdominal cramping.  If you are using any tobacco products, including cigarettes, chewing tobacco, and electronic cigarettes.  If you have any questions.  Other tests or screenings that may be performed during your third trimester include:  Blood tests that check for low iron levels (anemia).  Fetal testing to check the health, activity level, and growth of the fetus. Testing is done if you have certain medical conditions or if there are problems during the  pregnancy.  Nonstress test (NST). This test checks the health of your baby to make sure there are no signs of problems, such as the baby not getting enough oxygen. During this test, a belt is placed around your belly. The baby is made to move, and its heart rate is monitored during movement.  What is false labor? False labor is a condition in which you feel small, irregular tightenings of the muscles in the womb (contractions) that usually go away with rest, changing position, or drinking water. These are called Braxton Hicks contractions. Contractions may last for hours, days, or even weeks before true labor sets in. If contractions come at regular intervals, become more frequent, increase in intensity, or become painful, you should see your health care provider. What are the signs of labor?  Abdominal cramps.  Regular contractions that start at 10 minutes apart and become stronger and more frequent with time.  Contractions that start on the top of the uterus and spread down to the lower abdomen and back.  Increased pelvic pressure and dull back pain.  A watery or bloody mucus discharge that comes from the vagina.  Leaking of amniotic fluid. This is also known as your "water breaking." It could be a slow trickle or a gush. Let your health care provider know if it has a color or strange odor. If you have any of these signs, call your health care provider right away, even if it is before your due date. Follow these instructions at home: Medicines  Follow your health care provider's instructions regarding medicine use. Specific medicines may be either safe or unsafe to take during pregnancy.  Take a prenatal vitamin that contains at least 600 micrograms (mcg) of folic acid.  If you develop constipation, try taking a stool softener if your health care provider approves. Eating and drinking  Eat a balanced diet that includes fresh fruits and vegetables, whole grains, good sources of protein  such as meat, eggs, or tofu, and low-fat dairy. Your health care provider will help you determine the amount of weight gain that is right for you.  Avoid raw meat and uncooked cheese. These carry germs that can cause birth defects in the baby.  If you have low calcium intake from food, talk to your health care provider about whether you should take a daily calcium supplement.  Eat four or five small meals rather than three large meals a day.  Limit foods that are high in fat and processed sugars, such as fried and sweet foods.  To prevent constipation: ? Drink enough fluid to keep your urine clear or pale yellow. ? Eat foods that are high in fiber, such as fresh fruits and vegetables, whole grains, and beans. Activity  Exercise only as directed by your health care provider. Most women can continue their usual exercise routine during pregnancy. Try to exercise for 30 minutes at least 5 days a week. Stop exercising if you experience uterine contractions.  Avoid heavy   lifting.  Do not exercise in extreme heat or humidity, or at high altitudes.  Wear low-heel, comfortable shoes.  Practice good posture.  You may continue to have sex unless your health care provider tells you otherwise. Relieving pain and discomfort  Take frequent breaks and rest with your legs elevated if you have leg cramps or low back pain.  Take warm sitz baths to soothe any pain or discomfort caused by hemorrhoids. Use hemorrhoid cream if your health care provider approves.  Wear a good support bra to prevent discomfort from breast tenderness.  If you develop varicose veins: ? Wear support pantyhose or compression stockings as told by your healthcare provider. ? Elevate your feet for 15 minutes, 3-4 times a day. Prenatal care  Write down your questions. Take them to your prenatal visits.  Keep all your prenatal visits as told by your health care provider. This is important. Safety  Wear your seat belt at  all times when driving.  Make a list of emergency phone numbers, including numbers for family, friends, the hospital, and police and fire departments. General instructions  Avoid cat litter boxes and soil used by cats. These carry germs that can cause birth defects in the baby. If you have a cat, ask someone to clean the litter box for you.  Do not travel far distances unless it is absolutely necessary and only with the approval of your health care provider.  Do not use hot tubs, steam rooms, or saunas.  Do not drink alcohol.  Do not use any products that contain nicotine or tobacco, such as cigarettes and e-cigarettes. If you need help quitting, ask your health care provider.  Do not use any medicinal herbs or unprescribed drugs. These chemicals affect the formation and growth of the baby.  Do not douche or use tampons or scented sanitary pads.  Do not cross your legs for long periods of time.  To prepare for the arrival of your baby: ? Take prenatal classes to understand, practice, and ask questions about labor and delivery. ? Make a trial run to the hospital. ? Visit the hospital and tour the maternity area. ? Arrange for maternity or paternity leave through employers. ? Arrange for family and friends to take care of pets while you are in the hospital. ? Purchase a rear-facing car seat and make sure you know how to install it in your car. ? Pack your hospital bag. ? Prepare the baby's nursery. Make sure to remove all pillows and stuffed animals from the baby's crib to prevent suffocation.  Visit your dentist if you have not gone during your pregnancy. Use a soft toothbrush to brush your teeth and be gentle when you floss. Contact a health care provider if:  You are unsure if you are in labor or if your water has broken.  You become dizzy.  You have mild pelvic cramps, pelvic pressure, or nagging pain in your abdominal area.  You have lower back pain.  You have persistent  nausea, vomiting, or diarrhea.  You have an unusual or bad smelling vaginal discharge.  You have pain when you urinate. Get help right away if:  Your water breaks before 37 weeks.  You have regular contractions less than 5 minutes apart before 37 weeks.  You have a fever.  You are leaking fluid from your vagina.  You have spotting or bleeding from your vagina.  You have severe abdominal pain or cramping.  You have rapid weight loss or weight gain.    You have shortness of breath with chest pain.  You notice sudden or extreme swelling of your face, hands, ankles, feet, or legs.  Your baby makes fewer than 10 movements in 2 hours.  You have severe headaches that do not go away when you take medicine.  You have vision changes. Summary  The third trimester is from week 28 through week 40, months 7 through 9. The third trimester is a time when the unborn baby (fetus) is growing rapidly.  During the third trimester, your discomfort may increase as you and your baby continue to gain weight. You may have abdominal, leg, and back pain, sleeping problems, and an increased need to urinate.  During the third trimester your breasts will keep growing and they will continue to become tender. A yellow fluid (colostrum) may leak from your breasts. This is the first milk you are producing for your baby.  False labor is a condition in which you feel small, irregular tightenings of the muscles in the womb (contractions) that eventually go away. These are called Braxton Hicks contractions. Contractions may last for hours, days, or even weeks before true labor sets in.  Signs of labor can include: abdominal cramps; regular contractions that start at 10 minutes apart and become stronger and more frequent with time; watery or bloody mucus discharge that comes from the vagina; increased pelvic pressure and dull back pain; and leaking of amniotic fluid. This information is not intended to replace advice  given to you by your health care provider. Make sure you discuss any questions you have with your health care provider. Document Released: 09/28/2001 Document Revised: 03/11/2016 Document Reviewed: 12/05/2012 Elsevier Interactive Patient Education  2017 [employer]  

## 2017-03-31 ENCOUNTER — Encounter: Payer: 59 | Admitting: Medical

## 2017-03-31 LAB — GLUCOSE TOLERANCE, 2 HOURS W/ 1HR
GLUCOSE, FASTING: 72 mg/dL (ref 65–91)
Glucose, 1 hour: 91 mg/dL (ref 65–179)
Glucose, 2 hour: 77 mg/dL (ref 65–152)

## 2017-03-31 LAB — CBC
HEMATOCRIT: 31.8 % — AB (ref 34.0–46.6)
Hemoglobin: 10.2 g/dL — ABNORMAL LOW (ref 11.1–15.9)
MCH: 28.3 pg (ref 26.6–33.0)
MCHC: 32.1 g/dL (ref 31.5–35.7)
MCV: 88 fL (ref 79–97)
PLATELETS: 227 10*3/uL (ref 150–379)
RBC: 3.61 x10E6/uL — ABNORMAL LOW (ref 3.77–5.28)
RDW: 14.6 % (ref 12.3–15.4)
WBC: 10.7 10*3/uL (ref 3.4–10.8)

## 2017-03-31 LAB — HIV ANTIBODY (ROUTINE TESTING W REFLEX): HIV Screen 4th Generation wRfx: NONREACTIVE

## 2017-03-31 LAB — RPR: RPR Ser Ql: NONREACTIVE

## 2017-04-06 NOTE — Progress Notes (Signed)
04/06/17 Addendum: FMLA paperwork completed today 04/06/17.

## 2017-04-13 ENCOUNTER — Ambulatory Visit (INDEPENDENT_AMBULATORY_CARE_PROVIDER_SITE_OTHER): Payer: 59 | Admitting: Student

## 2017-04-13 ENCOUNTER — Encounter: Payer: Self-pay | Admitting: Student

## 2017-04-13 VITALS — BP 132/76 | HR 93 | Wt 318.0 lb

## 2017-04-13 DIAGNOSIS — O99213 Obesity complicating pregnancy, third trimester: Secondary | ICD-10-CM

## 2017-04-13 DIAGNOSIS — Z3403 Encounter for supervision of normal first pregnancy, third trimester: Secondary | ICD-10-CM

## 2017-04-13 DIAGNOSIS — O26843 Uterine size-date discrepancy, third trimester: Secondary | ICD-10-CM

## 2017-04-13 DIAGNOSIS — O99210 Obesity complicating pregnancy, unspecified trimester: Secondary | ICD-10-CM

## 2017-04-13 DIAGNOSIS — E669 Obesity, unspecified: Secondary | ICD-10-CM

## 2017-04-13 NOTE — Patient Instructions (Signed)
Eating Plan for Pregnant Women While you are pregnant, your body will require additional nutrition to help support your growing baby. It is recommended that you consume:  150 additional calories each day during your first trimester.  300 additional calories each day during your second trimester.  300 additional calories each day during your third trimester.  Eating a healthy, well-balanced diet is very important for your health and for your baby's health. You also have a higher need for some vitamins and minerals, such as folic acid, calcium, iron, and vitamin D. What do I need to know about eating during pregnancy?  Do not try to lose weight or go on a diet during pregnancy.  Choose healthy, nutritious foods. Choose  of a sandwich with a glass of milk instead of a candy bar or a high-calorie sugar-sweetened beverage.  Limit your overall intake of foods that have "empty calories." These are foods that have little nutritional value, such as sweets, desserts, candies, sugar-sweetened beverages, and fried foods.  Eat a variety of foods, especially fruits and vegetables.  Take a prenatal vitamin to help meet the additional needs during pregnancy, specifically for folic acid, iron, calcium, and vitamin D.  Remember to stay active. Ask your health care provider for exercise recommendations that are specific to you.  Practice good food safety and cleanliness, such as washing your hands before you eat and after you prepare raw meat. This helps to prevent foodborne illnesses, such as listeriosis, that can be very dangerous for your baby. Ask your health care provider for more information about listeriosis. What does 150 extra calories look like? Healthy options for an additional 150 calories each day could be any of the following:  Plain low-fat yogurt (6-8 oz) with  cup of berries.  1 apple with 2 teaspoons of peanut butter.  Cut-up vegetables with  cup of hummus.  Low-fat chocolate  milk (8 oz or 1 cup).  1 string cheese with 1 medium orange.   of a peanut butter and jelly sandwich on whole-wheat bread (1 tsp of peanut butter).  For 300 calories, you could eat two of those healthy options each day. What is a healthy amount of weight to gain? The recommended amount of weight for you to gain is based on your pre-pregnancy BMI. If your pre-pregnancy BMI was:  Less than 18 (underweight), you should gain 28-40 lb.  18-24.9 (normal), you should gain 25-35 lb.  25-29.9 (overweight), you should gain 15-25 lb.  Greater than 30 (obese), you should gain 11-20 lb.  What if I am having twins or multiples? Generally, pregnant women who will be having twins or multiples may need to increase their daily calories by 300-600 calories each day. The recommended range for total weight gain is 25-54 lb, depending on your pre-pregnancy BMI. Talk with your health care provider for specific guidance about additional nutritional needs, weight gain, and exercise during your pregnancy. What foods can I eat? Grains Any grains. Try to choose whole grains, such as whole-wheat bread, oatmeal, or brown rice. Vegetables Any vegetables. Try to eat a variety of colors and types of vegetables to get a full range of vitamins and minerals. Remember to wash your vegetables well before eating. Fruits Any fruits. Try to eat a variety of colors and types of fruit to get a full range of vitamins and minerals. Remember to wash your fruits well before eating. Meats and Other Protein Sources Lean meats, including chicken, turkey, fish, and lean cuts of beef, veal,   or pork. Make sure that all meats are cooked to "well done." Tofu. Tempeh. Beans. Eggs. Peanut butter and other nut butters. Seafood, such as shrimp, crab, and lobster. If you choose fish, select types that are higher in omega-3 fatty acids, including salmon, herring, mussels, trout, sardines, and pollock. Make sure that all meats are cooked to  food-safe temperatures. Dairy Pasteurized milk and milk alternatives. Pasteurized yogurt and pasteurized cheese. Cottage cheese. Sour cream. Beverages Water. Juices that contain 100% fruit juice or vegetable juice. Caffeine-free teas and decaffeinated coffee. Drinks that contain caffeine are okay to drink, but it is better to avoid caffeine. Keep your total caffeine intake to less than 200 mg each day (12 oz of coffee, tea, or soda) or as directed by your health care provider. Condiments Any pasteurized condiments. Sweets and Desserts Any sweets and desserts. Fats and Oils Any fats and oils. The items listed above may not be a complete list of recommended foods or beverages. Contact your dietitian for more options. What foods are not recommended? Vegetables Unpasteurized (raw) vegetable juices. Fruits Unpasteurized (raw) fruit juices. Meats and Other Protein Sources Cured meats that have nitrates, such as bacon, salami, and hotdogs. Luncheon meats, bologna, or other deli meats (unless they are reheated until they are steaming hot). Refrigerated pate, meat spreads from a meat counter, smoked seafood that is found in the refrigerated section of a store. Raw fish, such as sushi or sashimi. High mercury content fish, such as tilefish, shark, swordfish, and king mackerel. Raw meats, such as tuna or beef tartare. Undercooked meats and poultry. Make sure that all meats are cooked to food-safe temperatures. Dairy Unpasteurized (raw) milk and any foods that have raw milk in them. Soft cheeses, such as feta, queso blanco, queso fresco, Brie, Camembert cheeses, blue-veined cheeses, and Panela cheese (unless it is made with pasteurized milk, which must be stated on the label). Beverages Alcohol. Sugar-sweetened beverages, such as sodas, teas, or energy drinks. Condiments Homemade fermented foods and drinks, such as pickles, sauerkraut, or kombucha drinks. (Store-bought pasteurized versions of these are  okay.) Other Salads that are made in the store, such as ham salad, chicken salad, egg salad, tuna salad, and seafood salad. The items listed above may not be a complete list of foods and beverages to avoid. Contact your dietitian for more information. This information is not intended to replace advice given to you by your health care provider. Make sure you discuss any questions you have with your health care provider. Document Released: 07/19/2014 Document Revised: 03/11/2016 Document Reviewed: 03/19/2014 Elsevier Interactive Patient Education  2018 [employer]   

## 2017-04-13 NOTE — Progress Notes (Signed)
   PRENATAL VISIT NOTE  Subjective:  Kaitlin Jackson is a 26 y.o. G2P0010 at 2010w2d being seen today for ongoing prenatal care.  She is currently monitored for the following issues for this high-risk pregnancy and has Supervision of normal pregnancy; Obesity in pregnancy, antepartum; Rubella non-immune status, antepartum; Marijuana use; and Uterine size date discrepancy pregnancy, third trimester on her problem list.  Patient reports no complaints.  Contractions: Not present. Vag. Bleeding: None.  Movement: Present. Denies leaking of fluid.   The following portions of the patient's history were reviewed and updated as appropriate: allergies, current medications, past family history, past medical history, past social history, past surgical history and problem list. Problem list updated.  Objective:   Vitals:   04/13/17 0756  BP: 132/76  Pulse: 93  Weight: (!) 318 lb (144.2 kg)    Fetal Status: Fetal Heart Rate (bpm): 140 Fundal Height: 37 cm Movement: Present     General:  Alert, oriented and cooperative. Patient is in no acute distress.  Skin: Skin is warm and dry. No rash noted.   Cardiovascular: Normal heart rate noted  Respiratory: Normal respiratory effort, no problems with respiration noted  Abdomen: Soft, gravid, appropriate for gestational age. Pain/Pressure: Absent     Pelvic:  Cervical exam deferred        Extremities: Normal range of motion.  Edema: Trace  Mental Status: Normal mood and affect. Normal behavior. Normal judgment and thought content.   Assessment and Plan:  Pregnancy: G2P0010 at 7510w2d  1. Encounter for supervision of normal first pregnancy in third trimester   2. Uterine size date discrepancy pregnancy, third trimester -?d/t body habitus - US MFM OB FOLLOW UP; Future  3. Obesity in pregnancy, antepartum -Discussed diet & exercise  Preterm labor symptoms and general obstetric precautions including but not limited to vaginal bleeding, contractions,  leaking of fluid and fetal movement were reviewed in detail with the patient. Please refer to After Visit Summary for other counseling recommendations.  Return in about 2 weeks (around 04/27/2017) for Routine OB.   Judeth HornErin Anesa Fronek, NP

## 2017-04-18 ENCOUNTER — Other Ambulatory Visit: Payer: Self-pay | Admitting: Student

## 2017-04-18 ENCOUNTER — Ambulatory Visit (HOSPITAL_COMMUNITY)
Admission: RE | Admit: 2017-04-18 | Discharge: 2017-04-18 | Disposition: A | Discharge status: Home / Self Care | Payer: 59 | Source: Ambulatory Visit | Attending: Student | Admitting: Student

## 2017-04-18 DIAGNOSIS — Z3A31 31 weeks gestation of pregnancy: Secondary | ICD-10-CM

## 2017-04-18 DIAGNOSIS — O26843 Uterine size-date discrepancy, third trimester: Secondary | ICD-10-CM | POA: Diagnosis not present

## 2017-04-27 ENCOUNTER — Ambulatory Visit (INDEPENDENT_AMBULATORY_CARE_PROVIDER_SITE_OTHER): Payer: 59 | Admitting: Advanced Practice Midwife

## 2017-04-27 VITALS — BP 125/80 | HR 90 | Wt 316.5 lb

## 2017-04-27 DIAGNOSIS — Z3403 Encounter for supervision of normal first pregnancy, third trimester: Secondary | ICD-10-CM

## 2017-04-27 DIAGNOSIS — Z3400 Encounter for supervision of normal first pregnancy, unspecified trimester: Secondary | ICD-10-CM

## 2017-04-27 NOTE — Progress Notes (Signed)
Patient ID: Kaitlin Jackson, female   DOB: 02/11/1991, 26 y.o.   MRN: [id number]   PRENATAL VISIT NOTE  Subjective:  Kaitlin OmanKenyatta L Kumari is a 26 y.o. G2P0010 at 9928w2d being seen today for ongoing prenatal care.  She is currently monitored for the following issues for this low-risk pregnancy and has Supervision of normal pregnancy; Obesity in pregnancy, antepartum; Rubella non-immune status, antepartum; Marijuana use; and Uterine size date discrepancy pregnancy, third trimester on her problem list.  Patient reports no complaints.  Contractions: Not present. Vag. Bleeding: None.  Movement: Present. Denies leaking of fluid.   The following portions of the patient's history were reviewed and updated as appropriate: allergies, current medications, past family history, past medical history, past social history, past surgical history and problem list. Problem list updated.  Objective:   Vitals:   04/27/17 0817  BP: 125/80  Pulse: 90  Weight: (!) 316 lb 8 oz (143.6 kg)    Fetal Status: Fetal Heart Rate (bpm): 137   Movement: Present     General:  Alert, oriented and cooperative. Patient is in no acute distress.  Skin: Skin is warm and dry. No rash noted.   Cardiovascular: Normal heart rate noted  Respiratory: Normal respiratory effort, no problems with respiration noted  Abdomen: Soft, gravid, appropriate for gestational age. Pain/Pressure: Absent     Pelvic:  Cervical exam deferred        Extremities: Normal range of motion.  Edema: Trace  Mental Status: Normal mood and affect. Normal behavior. Normal judgment and thought content.   Assessment and Plan:  Pregnancy: G2P0010 at 7028w2d  1. Supervision of normal first pregnancy, antepartum -FU in 2 weeks.   Preterm labor symptoms and general obstetric precautions including but not limited to vaginal bleeding, contractions, leaking of fluid and fetal movement were reviewed in detail with the patient. Please refer to After Visit Summary for  other counseling recommendations.  Return in about 2 weeks (around 05/11/2017).   Thressa ShellerHeather Jaheem Hedgepath, CNM

## 2017-04-27 NOTE — Progress Notes (Signed)
C/o edema in right ankle as she has been having- states support hose are helping.

## 2017-04-27 NOTE — Patient Instructions (Signed)
Third Trimester of Pregnancy The third trimester is from week 28 through week 40 (months 7 through 9). The third trimester is a time when the unborn baby (fetus) is growing rapidly. At the end of the ninth month, the fetus is about 20 inches in length and weighs 6-10 pounds. Body changes during your third trimester Your body will continue to go through many changes during pregnancy. The changes vary from woman to woman. During the third trimester:  Your weight will continue to increase. You can expect to gain 25-35 pounds (11-16 kg) by the end of the pregnancy.  You may begin to get stretch marks on your hips, abdomen, and breasts.  You may urinate more often because the fetus is moving lower into your pelvis and pressing on your bladder.  You may develop or continue to have heartburn. This is caused by increased hormones that slow down muscles in the digestive tract.  You may develop or continue to have constipation because increased hormones slow digestion and cause the muscles that push waste through your intestines to relax.  You may develop hemorrhoids. These are swollen veins (varicose veins) in the rectum that can itch or be painful.  You may develop swollen, bulging veins (varicose veins) in your legs.  You may have increased body aches in the pelvis, back, or thighs. This is due to weight gain and increased hormones that are relaxing your joints.  You may have changes in your hair. These can include thickening of your hair, rapid growth, and changes in texture. Some women also have hair loss during or after pregnancy, or hair that feels dry or thin. Your hair will most likely return to normal after your baby is born.  Your breasts will continue to grow and they will continue to become tender. A yellow fluid (colostrum) may leak from your breasts. This is the first milk you are producing for your baby.  Your belly button may stick out.  You may notice more swelling in your hands,  face, or ankles.  You may have increased tingling or numbness in your hands, arms, and legs. The skin on your belly may also feel numb.  You may feel short of breath because of your expanding uterus.  You may have more problems sleeping. This can be caused by the size of your belly, increased need to urinate, and an increase in your body's metabolism.  You may notice the fetus "dropping," or moving lower in your abdomen (lightening).  You may have increased vaginal discharge.  You may notice your joints feel loose and you may have pain around your pelvic bone.  What to expect at prenatal visits You will have prenatal exams every 2 weeks until week 36. Then you will have weekly prenatal exams. During a routine prenatal visit:  You will be weighed to make sure you and the baby are growing normally.  Your blood pressure will be taken.  Your abdomen will be measured to track your baby's growth.  The fetal heartbeat will be listened to.  Any test results from the previous visit will be discussed.  You may have a cervical check near your due date to see if your cervix has softened or thinned (effaced).  You will be tested for Group B streptococcus. This happens between 35 and 37 weeks.  Your health care provider may ask you:  What your birth plan is.  How you are feeling.  If you are feeling the baby move.  If you have had   any abnormal symptoms, such as leaking fluid, bleeding, severe headaches, or abdominal cramping.  If you are using any tobacco products, including cigarettes, chewing tobacco, and electronic cigarettes.  If you have any questions.  Other tests or screenings that may be performed during your third trimester include:  Blood tests that check for low iron levels (anemia).  Fetal testing to check the health, activity level, and growth of the fetus. Testing is done if you have certain medical conditions or if there are problems during the  pregnancy.  Nonstress test (NST). This test checks the health of your baby to make sure there are no signs of problems, such as the baby not getting enough oxygen. During this test, a belt is placed around your belly. The baby is made to move, and its heart rate is monitored during movement.  What is false labor? False labor is a condition in which you feel small, irregular tightenings of the muscles in the womb (contractions) that usually go away with rest, changing position, or drinking water. These are called Braxton Hicks contractions. Contractions may last for hours, days, or even weeks before true labor sets in. If contractions come at regular intervals, become more frequent, increase in intensity, or become painful, you should see your health care provider. What are the signs of labor?  Abdominal cramps.  Regular contractions that start at 10 minutes apart and become stronger and more frequent with time.  Contractions that start on the top of the uterus and spread down to the lower abdomen and back.  Increased pelvic pressure and dull back pain.  A watery or bloody mucus discharge that comes from the vagina.  Leaking of amniotic fluid. This is also known as your "water breaking." It could be a slow trickle or a gush. Let your health care provider know if it has a color or strange odor. If you have any of these signs, call your health care provider right away, even if it is before your due date. Follow these instructions at home: Medicines  Follow your health care provider's instructions regarding medicine use. Specific medicines may be either safe or unsafe to take during pregnancy.  Take a prenatal vitamin that contains at least 600 micrograms (mcg) of folic acid.  If you develop constipation, try taking a stool softener if your health care provider approves. Eating and drinking  Eat a balanced diet that includes fresh fruits and vegetables, whole grains, good sources of protein  such as meat, eggs, or tofu, and low-fat dairy. Your health care provider will help you determine the amount of weight gain that is right for you.  Avoid raw meat and uncooked cheese. These carry germs that can cause birth defects in the baby.  If you have low calcium intake from food, talk to your health care provider about whether you should take a daily calcium supplement.  Eat four or five small meals rather than three large meals a day.  Limit foods that are high in fat and processed sugars, such as fried and sweet foods.  To prevent constipation: ? Drink enough fluid to keep your urine clear or pale yellow. ? Eat foods that are high in fiber, such as fresh fruits and vegetables, whole grains, and beans. Activity  Exercise only as directed by your health care provider. Most women can continue their usual exercise routine during pregnancy. Try to exercise for 30 minutes at least 5 days a week. Stop exercising if you experience uterine contractions.  Avoid heavy   lifting.  Do not exercise in extreme heat or humidity, or at high altitudes.  Wear low-heel, comfortable shoes.  Practice good posture.  You may continue to have sex unless your health care provider tells you otherwise. Relieving pain and discomfort  Take frequent breaks and rest with your legs elevated if you have leg cramps or low back pain.  Take warm sitz baths to soothe any pain or discomfort caused by hemorrhoids. Use hemorrhoid cream if your health care provider approves.  Wear a good support bra to prevent discomfort from breast tenderness.  If you develop varicose veins: ? Wear support pantyhose or compression stockings as told by your healthcare provider. ? Elevate your feet for 15 minutes, 3-4 times a day. Prenatal care  Write down your questions. Take them to your prenatal visits.  Keep all your prenatal visits as told by your health care provider. This is important. Safety  Wear your seat belt at  all times when driving.  Make a list of emergency phone numbers, including numbers for family, friends, the hospital, and police and fire departments. General instructions  Avoid cat litter boxes and soil used by cats. These carry germs that can cause birth defects in the baby. If you have a cat, ask someone to clean the litter box for you.  Do not travel far distances unless it is absolutely necessary and only with the approval of your health care provider.  Do not use hot tubs, steam rooms, or saunas.  Do not drink alcohol.  Do not use any products that contain nicotine or tobacco, such as cigarettes and e-cigarettes. If you need help quitting, ask your health care provider.  Do not use any medicinal herbs or unprescribed drugs. These chemicals affect the formation and growth of the baby.  Do not douche or use tampons or scented sanitary pads.  Do not cross your legs for long periods of time.  To prepare for the arrival of your baby: ? Take prenatal classes to understand, practice, and ask questions about labor and delivery. ? Make a trial run to the hospital. ? Visit the hospital and tour the maternity area. ? Arrange for maternity or paternity leave through employers. ? Arrange for family and friends to take care of pets while you are in the hospital. ? Purchase a rear-facing car seat and make sure you know how to install it in your car. ? Pack your hospital bag. ? Prepare the baby's nursery. Make sure to remove all pillows and stuffed animals from the baby's crib to prevent suffocation.  Visit your dentist if you have not gone during your pregnancy. Use a soft toothbrush to brush your teeth and be gentle when you floss. Contact a health care provider if:  You are unsure if you are in labor or if your water has broken.  You become dizzy.  You have mild pelvic cramps, pelvic pressure, or nagging pain in your abdominal area.  You have lower back pain.  You have persistent  nausea, vomiting, or diarrhea.  You have an unusual or bad smelling vaginal discharge.  You have pain when you urinate. Get help right away if:  Your water breaks before 37 weeks.  You have regular contractions less than 5 minutes apart before 37 weeks.  You have a fever.  You are leaking fluid from your vagina.  You have spotting or bleeding from your vagina.  You have severe abdominal pain or cramping.  You have rapid weight loss or weight gain.    You have shortness of breath with chest pain.  You notice sudden or extreme swelling of your face, hands, ankles, feet, or legs.  Your baby makes fewer than 10 movements in 2 hours.  You have severe headaches that do not go away when you take medicine.  You have vision changes. Summary  The third trimester is from week 28 through week 40, months 7 through 9. The third trimester is a time when the unborn baby (fetus) is growing rapidly.  During the third trimester, your discomfort may increase as you and your baby continue to gain weight. You may have abdominal, leg, and back pain, sleeping problems, and an increased need to urinate.  During the third trimester your breasts will keep growing and they will continue to become tender. A yellow fluid (colostrum) may leak from your breasts. This is the first milk you are producing for your baby.  False labor is a condition in which you feel small, irregular tightenings of the muscles in the womb (contractions) that eventually go away. These are called Braxton Hicks contractions. Contractions may last for hours, days, or even weeks before true labor sets in.  Signs of labor can include: abdominal cramps; regular contractions that start at 10 minutes apart and become stronger and more frequent with time; watery or bloody mucus discharge that comes from the vagina; increased pelvic pressure and dull back pain; and leaking of amniotic fluid. This information is not intended to replace advice  given to you by your health care provider. Make sure you discuss any questions you have with your health care provider. Document Released: 09/28/2001 Document Revised: 03/11/2016 Document Reviewed: 12/05/2012 Elsevier Interactive Patient Education  2017 [employer]  

## 2017-04-28 ENCOUNTER — Encounter (HOSPITAL_COMMUNITY): Payer: Self-pay

## 2017-04-28 ENCOUNTER — Inpatient Hospital Stay (HOSPITAL_COMMUNITY)
Admission: AD | Admit: 2017-04-28 | Discharge: 2017-04-28 | Disposition: A | Discharge status: Home / Self Care | Payer: 59 | Source: Ambulatory Visit | Attending: Obstetrics & Gynecology | Admitting: Obstetrics & Gynecology

## 2017-04-28 ENCOUNTER — Inpatient Hospital Stay (HOSPITAL_COMMUNITY): Payer: 59

## 2017-04-28 DIAGNOSIS — O99891 Other specified diseases and conditions complicating pregnancy: Secondary | ICD-10-CM

## 2017-04-28 DIAGNOSIS — O09899 Supervision of other high risk pregnancies, unspecified trimester: Secondary | ICD-10-CM

## 2017-04-28 DIAGNOSIS — W1839XA Other fall on same level, initial encounter: Secondary | ICD-10-CM | POA: Diagnosis not present

## 2017-04-28 DIAGNOSIS — O26893 Other specified pregnancy related conditions, third trimester: Secondary | ICD-10-CM | POA: Diagnosis not present

## 2017-04-28 DIAGNOSIS — Y92481 Parking lot as the place of occurrence of the external cause: Secondary | ICD-10-CM | POA: Insufficient documentation

## 2017-04-28 DIAGNOSIS — Z87891 Personal history of nicotine dependence: Secondary | ICD-10-CM | POA: Insufficient documentation

## 2017-04-28 DIAGNOSIS — E669 Obesity, unspecified: Secondary | ICD-10-CM | POA: Insufficient documentation

## 2017-04-28 DIAGNOSIS — M549 Dorsalgia, unspecified: Secondary | ICD-10-CM | POA: Insufficient documentation

## 2017-04-28 DIAGNOSIS — O99210 Obesity complicating pregnancy, unspecified trimester: Secondary | ICD-10-CM

## 2017-04-28 DIAGNOSIS — Y9301 Activity, walking, marching and hiking: Secondary | ICD-10-CM | POA: Insufficient documentation

## 2017-04-28 DIAGNOSIS — Z3A32 32 weeks gestation of pregnancy: Secondary | ICD-10-CM | POA: Insufficient documentation

## 2017-04-28 DIAGNOSIS — Z6841 Body Mass Index (BMI) 40.0 and over, adult: Secondary | ICD-10-CM | POA: Diagnosis not present

## 2017-04-28 DIAGNOSIS — O9989 Other specified diseases and conditions complicating pregnancy, childbirth and the puerperium: Secondary | ICD-10-CM | POA: Diagnosis not present

## 2017-04-28 DIAGNOSIS — W19XXXA Unspecified fall, initial encounter: Secondary | ICD-10-CM

## 2017-04-28 DIAGNOSIS — Z2839 Other underimmunization status: Secondary | ICD-10-CM

## 2017-04-28 DIAGNOSIS — O9A213 Injury, poisoning and certain other consequences of external causes complicating pregnancy, third trimester: Secondary | ICD-10-CM | POA: Diagnosis not present

## 2017-04-28 DIAGNOSIS — O99213 Obesity complicating pregnancy, third trimester: Secondary | ICD-10-CM | POA: Insufficient documentation

## 2017-04-28 DIAGNOSIS — Z283 Underimmunization status: Secondary | ICD-10-CM

## 2017-04-28 DIAGNOSIS — O9A219 Injury, poisoning and certain other consequences of external causes complicating pregnancy, unspecified trimester: Secondary | ICD-10-CM

## 2017-04-28 MED ORDER — ACETAMINOPHEN 500 MG PO TABS: 1000.0000 mg | ORAL_TABLET | Freq: Once | ORAL | Status: DC

## 2017-04-28 NOTE — Discharge Instructions (Signed)
Third Trimester of Pregnancy The third trimester is from week 28 through week 40 (months 7 through 9). The third trimester is a time when the unborn baby (fetus) is growing rapidly. At the end of the ninth month, the fetus is about 20 inches in length and weighs 6-10 pounds. Body changes during your third trimester Your body will continue to go through many changes during pregnancy. The changes vary from woman to woman. During the third trimester:  Your weight will continue to increase. You can expect to gain 25-35 pounds (11-16 kg) by the end of the pregnancy.  You may begin to get stretch marks on your hips, abdomen, and breasts.  You may urinate more often because the fetus is moving lower into your pelvis and pressing on your bladder.  You may develop or continue to have heartburn. This is caused by increased hormones that slow down muscles in the digestive tract.  You may develop or continue to have constipation because increased hormones slow digestion and cause the muscles that push waste through your intestines to relax.  You may develop hemorrhoids. These are swollen veins (varicose veins) in the rectum that can itch or be painful.  You may develop swollen, bulging veins (varicose veins) in your legs.  You may have increased body aches in the pelvis, back, or thighs. This is due to weight gain and increased hormones that are relaxing your joints.  You may have changes in your hair. These can include thickening of your hair, rapid growth, and changes in texture. Some women also have hair loss during or after pregnancy, or hair that feels dry or thin. Your hair will most likely return to normal after your baby is born.  Your breasts will continue to grow and they will continue to become tender. A yellow fluid (colostrum) may leak from your breasts. This is the first milk you are producing for your baby.  Your belly button may stick out.  You may notice more swelling in your hands,  face, or ankles.  You may have increased tingling or numbness in your hands, arms, and legs. The skin on your belly may also feel numb.  You may feel short of breath because of your expanding uterus.  You may have more problems sleeping. This can be caused by the size of your belly, increased need to urinate, and an increase in your body's metabolism.  You may notice the fetus "dropping," or moving lower in your abdomen (lightening).  You may have increased vaginal discharge.  You may notice your joints feel loose and you may have pain around your pelvic bone.  What to expect at prenatal visits You will have prenatal exams every 2 weeks until week 36. Then you will have weekly prenatal exams. During a routine prenatal visit:  You will be weighed to make sure you and the baby are growing normally.  Your blood pressure will be taken.  Your abdomen will be measured to track your baby's growth.  The fetal heartbeat will be listened to.  Any test results from the previous visit will be discussed.  You may have a cervical check near your due date to see if your cervix has softened or thinned (effaced).  You will be tested for Group B streptococcus. This happens between 35 and 37 weeks.  Your health care provider may ask you:  What your birth plan is.  How you are feeling.  If you are feeling the baby move.  If you have had   any abnormal symptoms, such as leaking fluid, bleeding, severe headaches, or abdominal cramping.  If you are using any tobacco products, including cigarettes, chewing tobacco, and electronic cigarettes.  If you have any questions.  Other tests or screenings that may be performed during your third trimester include:  Blood tests that check for low iron levels (anemia).  Fetal testing to check the health, activity level, and growth of the fetus. Testing is done if you have certain medical conditions or if there are problems during the  pregnancy.  Nonstress test (NST). This test checks the health of your baby to make sure there are no signs of problems, such as the baby not getting enough oxygen. During this test, a belt is placed around your belly. The baby is made to move, and its heart rate is monitored during movement.  What is false labor? False labor is a condition in which you feel small, irregular tightenings of the muscles in the womb (contractions) that usually go away with rest, changing position, or drinking water. These are called Braxton Hicks contractions. Contractions may last for hours, days, or even weeks before true labor sets in. If contractions come at regular intervals, become more frequent, increase in intensity, or become painful, you should see your health care provider. What are the signs of labor?  Abdominal cramps.  Regular contractions that start at 10 minutes apart and become stronger and more frequent with time.  Contractions that start on the top of the uterus and spread down to the lower abdomen and back.  Increased pelvic pressure and dull back pain.  A watery or bloody mucus discharge that comes from the vagina.  Leaking of amniotic fluid. This is also known as your "water breaking." It could be a slow trickle or a gush. Let your health care provider know if it has a color or strange odor. If you have any of these signs, call your health care provider right away, even if it is before your due date. Follow these instructions at home: Medicines  Follow your health care provider's instructions regarding medicine use. Specific medicines may be either safe or unsafe to take during pregnancy.  Take a prenatal vitamin that contains at least 600 micrograms (mcg) of folic acid.  If you develop constipation, try taking a stool softener if your health care provider approves. Eating and drinking  Eat a balanced diet that includes fresh fruits and vegetables, whole grains, good sources of protein  such as meat, eggs, or tofu, and low-fat dairy. Your health care provider will help you determine the amount of weight gain that is right for you.  Avoid raw meat and uncooked cheese. These carry germs that can cause birth defects in the baby.  If you have low calcium intake from food, talk to your health care provider about whether you should take a daily calcium supplement.  Eat four or five small meals rather than three large meals a day.  Limit foods that are high in fat and processed sugars, such as fried and sweet foods.  To prevent constipation: ? Drink enough fluid to keep your urine clear or pale yellow. ? Eat foods that are high in fiber, such as fresh fruits and vegetables, whole grains, and beans. Activity  Exercise only as directed by your health care provider. Most women can continue their usual exercise routine during pregnancy. Try to exercise for 30 minutes at least 5 days a week. Stop exercising if you experience uterine contractions.  Avoid heavy   lifting.  Do not exercise in extreme heat or humidity, or at high altitudes.  Wear low-heel, comfortable shoes.  Practice good posture.  You may continue to have sex unless your health care provider tells you otherwise. Relieving pain and discomfort  Take frequent breaks and rest with your legs elevated if you have leg cramps or low back pain.  Take warm sitz baths to soothe any pain or discomfort caused by hemorrhoids. Use hemorrhoid cream if your health care provider approves.  Wear a good support bra to prevent discomfort from breast tenderness.  If you develop varicose veins: ? Wear support pantyhose or compression stockings as told by your healthcare provider. ? Elevate your feet for 15 minutes, 3-4 times a day. Prenatal care  Write down your questions. Take them to your prenatal visits.  Keep all your prenatal visits as told by your health care provider. This is important. Safety  Wear your seat belt at  all times when driving.  Make a list of emergency phone numbers, including numbers for family, friends, the hospital, and police and fire departments. General instructions  Avoid cat litter boxes and soil used by cats. These carry germs that can cause birth defects in the baby. If you have a cat, ask someone to clean the litter box for you.  Do not travel far distances unless it is absolutely necessary and only with the approval of your health care provider.  Do not use hot tubs, steam rooms, or saunas.  Do not drink alcohol.  Do not use any products that contain nicotine or tobacco, such as cigarettes and e-cigarettes. If you need help quitting, ask your health care provider.  Do not use any medicinal herbs or unprescribed drugs. These chemicals affect the formation and growth of the baby.  Do not douche or use tampons or scented sanitary pads.  Do not cross your legs for long periods of time.  To prepare for the arrival of your baby: ? Take prenatal classes to understand, practice, and ask questions about labor and delivery. ? Make a trial run to the hospital. ? Visit the hospital and tour the maternity area. ? Arrange for maternity or paternity leave through employers. ? Arrange for family and friends to take care of pets while you are in the hospital. ? Purchase a rear-facing car seat and make sure you know how to install it in your car. ? Pack your hospital bag. ? Prepare the baby's nursery. Make sure to remove all pillows and stuffed animals from the baby's crib to prevent suffocation.  Visit your dentist if you have not gone during your pregnancy. Use a soft toothbrush to brush your teeth and be gentle when you floss. Contact a health care provider if:  You are unsure if you are in labor or if your water has broken.  You become dizzy.  You have mild pelvic cramps, pelvic pressure, or nagging pain in your abdominal area.  You have lower back pain.  You have persistent  nausea, vomiting, or diarrhea.  You have an unusual or bad smelling vaginal discharge.  You have pain when you urinate. Get help right away if:  Your water breaks before 37 weeks.  You have regular contractions less than 5 minutes apart before 37 weeks.  You have a fever.  You are leaking fluid from your vagina.  You have spotting or bleeding from your vagina.  You have severe abdominal pain or cramping.  You have rapid weight loss or weight gain.    You have shortness of breath with chest pain.  You notice sudden or extreme swelling of your face, hands, ankles, feet, or legs.  Your baby makes fewer than 10 movements in 2 hours.  You have severe headaches that do not go away when you take medicine.  You have vision changes. Summary  The third trimester is from week 28 through week 40, months 7 through 9. The third trimester is a time when the unborn baby (fetus) is growing rapidly.  During the third trimester, your discomfort may increase as you and your baby continue to gain weight. You may have abdominal, leg, and back pain, sleeping problems, and an increased need to urinate.  During the third trimester your breasts will keep growing and they will continue to become tender. A yellow fluid (colostrum) may leak from your breasts. This is the first milk you are producing for your baby.  False labor is a condition in which you feel small, irregular tightenings of the muscles in the womb (contractions) that eventually go away. These are called Braxton Hicks contractions. Contractions may last for hours, days, or even weeks before true labor sets in.  Signs of labor can include: abdominal cramps; regular contractions that start at 10 minutes apart and become stronger and more frequent with time; watery or bloody mucus discharge that comes from the vagina; increased pelvic pressure and dull back pain; and leaking of amniotic fluid. This information is not intended to replace advice  given to you by your health care provider. Make sure you discuss any questions you have with your health care provider. Document Released: 09/28/2001 Document Revised: 03/11/2016 Document Reviewed: 12/05/2012 Elsevier Interactive Patient Education  2017 [employer] Fetal Movement Counts Patient Name: ________________________________________________ Patient Due Date: ____________________ What is a fetal movement count? A fetal movement count is the number of times that you feel your baby move during a certain amount of time. This may also be called a fetal kick count. A fetal movement count is recommended for every pregnant woman. You may be asked to start counting fetal movements as early as week 28 of your pregnancy. Pay attention to when your baby is most active. You may notice your baby's sleep and wake cycles. You may also notice things that make your baby move more. You should do a fetal movement count:  When your baby is normally most active.  At the same time each day.  A good time to count movements is while you are resting, after having something to eat and drink. How do I count fetal movements? 1. Find a quiet, comfortable area. Sit, or lie down on your side. 2. Write down the date, the start time and stop time, and the number of movements that you felt between those two times. Take this information with you to your health care visits. 3. For 2 hours, count kicks, flutters, swishes, rolls, and jabs. You should feel at least 10 movements during 2 hours. 4. You may stop counting after you have felt 10 movements. 5. If you do not feel 10 movements in 2 hours, have something to eat and drink. Then, keep resting and counting for 1 hour. If you feel at least 4 movements during that hour, you may stop counting. Contact a health care provider if:  You feel fewer than 4 movements in 2 hours.  Your baby is not moving like he or she usually does. Date: ____________ Start time: ____________  Stop time: ____________ Movements: ____________ Date: ____________ Start time:   ____________ Stop time: ____________ Movements: ____________ Date: ____________ Start time: ____________ Stop time: ____________ Movements: ____________ Date: ____________ Start time: ____________ Stop time: ____________ Movements: ____________ Date: ____________ Start time: ____________ Stop time: ____________ Movements: ____________ Date: ____________ Start time: ____________ Stop time: ____________ Movements: ____________ Date: ____________ Start time: ____________ Stop time: ____________ Movements: ____________ Date: ____________ Start time: ____________ Stop time: ____________ Movements: ____________ Date: ____________ Start time: ____________ Stop time: ____________ Movements: ____________ This information is not intended to replace advice given to you by your health care provider. Make sure you discuss any questions you have with your health care provider. Document Released: 11/03/2006 Document Revised: 06/02/2016 Document Reviewed: 11/13/2015 Elsevier Interactive Patient Education  2018 [employer] Inc[employer]  

## 2017-04-28 NOTE — MAU Note (Signed)
Pt stated  she tripped an fell on her stomach about 2pm today. C/O back pan and some tightening in her abd since. Denies any vag bleeding or leaking at this time. Has felt baby move since fall

## 2017-04-28 NOTE — MAU Provider Note (Signed)
History     CSN: [id number]  Arrival date and time: 04/28/17 1421   First Provider Initiated Contact with Patient 04/28/17 1539      Chief Complaint  Patient presents with  . Fall   HPI Kaitlin Jackson is a 26 y.o. G2P0010 at 6896w3d who presents after a fall. She states she was walking into work and fell in the parking lot. She states she fell on her needs, hit her abdomen and chin. She reports good fetal movement. Denies any bleeding, leaking of fluid or abdominal pain. She reports constant back pain that she rates 5/10 and has not tried anything for the pain.   OB History    Gravida Para Term Preterm AB Living   2 0     1     SAB TAB Ectopic Multiple Live Births   1              Past Medical History:  Diagnosis Date  . Abnormal Pap smear   . Chlamydia   . Kidney stones   . Obesity   . Renal disorder   . Vaginal Pap smear, abnormal     Past Surgical History:  Procedure Laterality Date  . NO PAST SURGERIES      Family History  Problem Relation Age of Onset  . Miscarriages / [place] Mother   . Hypertension Mother   . Asthma Brother   . Diabetes Paternal Grandmother     Social History  Substance Use Topics  . Smoking status: Former Smoker    Packs/day: 0.50    Types: Cigarettes  . Smokeless tobacco: Never Used  . Alcohol use No    Allergies: No Known Allergies  Prescriptions Prior to Admission  Medication Sig Dispense Refill Last Dose  . Prenatal Vit-Fe Fumarate-FA (PREPLUS) 27-1 MG TABS Take 1 tablet by mouth daily. 30 tablet 13 Taking    Review of Systems  Constitutional: Negative.  Negative for chills and fever.  HENT: Negative.   Respiratory: Negative.  Negative for shortness of breath.   Cardiovascular: Negative.  Negative for chest pain.  Gastrointestinal: Negative for abdominal pain, constipation, diarrhea, nausea and vomiting.  Genitourinary: Negative.  Negative for dysuria, vaginal bleeding and vaginal discharge.  Musculoskeletal:  Positive for back pain.  Neurological: Negative.  Negative for dizziness and headaches.  Psychiatric/Behavioral: Negative.    Physical Exam   Blood pressure 131/62, pulse 82, temperature 97.9 F (36.6 C), resp. rate 18, height 5\' 5"  (1.651 m), weight (!) 318 lb (144.2 kg), last menstrual period 09/13/2016.  Physical Exam  Nursing note and vitals reviewed. Constitutional: She appears well-developed and well-nourished.  HENT:  Head: Normocephalic and atraumatic.  Eyes: Conjunctivae are normal. No scleral icterus.  Cardiovascular: Normal rate, regular rhythm and normal heart sounds.   Respiratory: Effort normal and breath sounds normal. No respiratory distress.  GI: Soft. She exhibits no distension. There is no tenderness.  Musculoskeletal:       Lumbar back: She exhibits tenderness.  Neurological: She is alert.  Skin: Skin is warm and dry.  Psychiatric: She has a normal mood and affect. Her behavior is normal. Judgment and thought content normal.   Fetal Tracing:  Baseline: 120 Variability: moderate Accelerations: 15x15 Decelerations: none  Toco: none  MAU Course  Procedures  MDM 4 hours of monitoring s/p fall Tylenol PO for back pain- patient reports relief Reactive FHR tracing, no contractions US OB Limited- placenta posterior above the os, visible portions of placenta appear normal, AFI wnl  Assessment and Plan   1. Fall, initial encounter   2. Rubella non-immune status, antepartum   3. Obesity in pregnancy, antepartum   4. Trauma during pregnancy   5. Back pain in pregnancy    -Discharge patient home in stable condition -Fetal kick counts and preterm labor precautions reviewed -[facility] up at [facility] as scheduled for prenatal care. -Encouraged to return here or to other Urgent Care/ED if she develops worsening of symptoms, increase in pain, fever, or other concerning symptoms.   Cleone Slim SNM 04/28/2017, 3:46 PM   The patient was seen and examined by me  also Agree with note NST reactive and reassuring UCs as listed Cervical exams as listed in note Ready for discharge Will have her followup in clinic as scheduled I confirm that I have verified the information documented in the SNM's note and that I have also personally reperformed the physical exam and all medical decision making activities.    Aviva Signs, CNM

## 2017-04-28 NOTE — Progress Notes (Addendum)
G2P) @ 32.[redacted] wksga. Presents to triage for falling on her abdomen in the employee parking lot. States landed on both knee and stomach area. Pain in back but not front. Denies bleeding or LOF. + FM  EFM applied.   1537: Provider at bs assessing pt and POC discussed  1644: U/s paged.   1800: dinner tray brought up and set up for pt to eat.

## 2017-05-12 ENCOUNTER — Ambulatory Visit (INDEPENDENT_AMBULATORY_CARE_PROVIDER_SITE_OTHER): Payer: 59 | Admitting: Obstetrics and Gynecology

## 2017-05-12 VITALS — BP 126/80 | HR 92 | Wt 321.0 lb

## 2017-05-12 DIAGNOSIS — O26843 Uterine size-date discrepancy, third trimester: Secondary | ICD-10-CM

## 2017-05-12 DIAGNOSIS — Z3403 Encounter for supervision of normal first pregnancy, third trimester: Secondary | ICD-10-CM

## 2017-05-12 DIAGNOSIS — O99213 Obesity complicating pregnancy, third trimester: Secondary | ICD-10-CM

## 2017-05-12 DIAGNOSIS — O99019 Anemia complicating pregnancy, unspecified trimester: Secondary | ICD-10-CM | POA: Insufficient documentation

## 2017-05-12 DIAGNOSIS — Z283 Underimmunization status: Secondary | ICD-10-CM

## 2017-05-12 DIAGNOSIS — O09899 Supervision of other high risk pregnancies, unspecified trimester: Secondary | ICD-10-CM

## 2017-05-12 DIAGNOSIS — O99013 Anemia complicating pregnancy, third trimester: Secondary | ICD-10-CM

## 2017-05-12 DIAGNOSIS — O99210 Obesity complicating pregnancy, unspecified trimester: Secondary | ICD-10-CM

## 2017-05-12 DIAGNOSIS — Z2839 Other underimmunization status: Secondary | ICD-10-CM

## 2017-05-12 DIAGNOSIS — O9989 Other specified diseases and conditions complicating pregnancy, childbirth and the puerperium: Secondary | ICD-10-CM

## 2017-05-12 NOTE — Progress Notes (Signed)
PRENATAL VISIT NOTE  Subjective:  Kaitlin Jackson is a 26 y.o. G2P0010 at 72w3dbeing seen today for ongoing prenatal care.  She is currently monitored for the following issues for this low-risk pregnancy and has Supervision of normal pregnancy; Obesity in pregnancy, antepartum; Rubella non-immune status, antepartum; Marijuana use; Uterine size date discrepancy pregnancy, third trimester; and Anemia in pregnancy on her problem list.  Patient reports no complaints.  Contractions: Irritability. Vag. Bleeding: None.  Movement: Present. Denies leaking of fluid.   The following portions of the patient's history were reviewed and updated as appropriate: allergies, current medications, past family history, past medical history, past social history, past surgical history and problem list. Problem list updated.  Objective:   Vitals:   05/12/17 0804  BP: 126/80  Pulse: 92  Weight: (!) 321 lb (145.6 kg)    Fetal Status: Fetal Heart Rate (bpm): 141 Fundal Height: 38 cm Movement: Present     General:  Alert, oriented and cooperative. Patient is in no acute distress.  Skin: Skin is warm and dry. No rash noted.   Cardiovascular: Normal heart rate noted  Respiratory: Normal respiratory effort, no problems with respiration noted  Abdomen: Soft, gravid, appropriate for gestational age.  Pain/Pressure: Present     Pelvic: Cervical exam deferred        Extremities: Normal range of motion.  Edema: None  Mental Status:  Normal mood and affect. Normal behavior. Normal judgment and thought content.   Assessment and Plan:  Pregnancy: G2P0010 at 32w3d1. Obesity in pregnancy, antepartum  Stay active Incorporate fruits and vegetables   2. Rubella non-immune status, antepartum  MMR postpartum; discussed with patient.   3. Encounter for supervision of normal first pregnancy in third trimester  Discussed GBS next visit List of Pediatricians given Reviewed 2 hour GTT results   4. Uterine size  date discrepancy pregnancy, third trimester  ? Body habitus  Last growth US[place] 7/2 shows Normal growth in the 66th   5. Anemia during pregnancy in third trimester  hgb 10.2; no treatment at this time.   Preterm labor symptoms and general obstetric precautions including but not limited to vaginal bleeding, contractions, leaking of fluid and fetal movement were reviewed in detail with the patient. Please refer to After Visit Summary for other counseling recommendations.  Return in about 2 weeks (around 05/26/2017).   Nakiah Osgood, JeArtist PaisNP

## 2017-05-12 NOTE — Patient Instructions (Addendum)
AREA PEDIATRIC/FAMILY PRACTICE PHYSICIANS  [facility] [address], [address] [place], [place]  [address] Phone - [phone]   Fax - [phone]  [facility] [address] [place], [place] [address] Phone - [phone]   Fax - [phone]  JACK AMOS [address] [place], [place]  [address] Phone - [phone]   Fax - [phone]  [facility] [address] [place], [place]  [address] Phone - [phone]   Fax - [phone]  [facility] [address] [place], [place]  [address] Phone - [phone]   Fax - [phone]  [facility] [address] [place], [place]  [address] Phone - [phone]   Fax - [phone]  [facility] [address] [place], [place]  [address] Phone - [phone]   Fax - [phone]  [facility] [address], [address] [place], [place]  [address] Phone - [phone]   Fax - [phone]  [facility] [address] [place], [place]  [address] Phone - [phone]   Fax - [phone] [facility] [address] [place], [place]  [address] Phone - [phone]   Fax - [phone]  [facility] [address] [place], [place]  [address] Phone - [phone]   Fax - [phone]  [facility] [address] [place], [place]  [address] Phone - [phone]   Fax - [phone]  [facility] [address], [address] [place], [place]  [address] Phone - [phone]   Fax - [phone]  SHILPA GOSRANI [address] [place], [place]  [address] Phone - [phone]  [facility] [address] [place], [place]  [address] Phone - [phone]   Fax - [phone]  [facility] [address] [place], [place]  [address] Phone - [phone]   Fax - [phone]  [facility] [address] [place], [place]  [address] Phone - [phone]   Fax - [phone]  [facility] [address] [place], [place]  [address] Phone - [phone]   Fax - [phone]   [facility] [address] [place], [place]  [address] Phone - [phone]   Fax - [phone]  [facility] [address] [place], [place]  [address] Phone - [phone]   Fax - [phone]  DAVID RUBIN [address] [place], [place]  [address] Phone - [phone]   Fax - [phone]  [facility] [address] [place], [place]  [address] Phone - [phone]   Fax - [phone]  [facility] [address] [place], [place]  [address] Phone - [phone]   Fax - [phone] [facility] [address] [place], [place]  [address] Phone - [phone]   Fax - [phone]  [facility] [address] [place], [place]  [address] Phone - [phone]   Fax - [phone]  [facility] [address] [place],   [address] Phone - [phone]   Fax - [phone]  [facility] Charlene Flemming MD [address] [place]  [address] Phone [phone]  Fax [phone]   

## 2017-05-26 ENCOUNTER — Encounter: Payer: Self-pay | Admitting: *Deleted

## 2017-05-26 ENCOUNTER — Encounter: Payer: Self-pay | Admitting: Obstetrics and Gynecology

## 2017-05-26 ENCOUNTER — Ambulatory Visit (INDEPENDENT_AMBULATORY_CARE_PROVIDER_SITE_OTHER): Payer: Self-pay | Admitting: Advanced Practice Midwife

## 2017-05-26 VITALS — BP 129/74 | HR 86 | Wt 324.0 lb

## 2017-05-26 DIAGNOSIS — Z113 Encounter for screening for infections with a predominantly sexual mode of transmission: Secondary | ICD-10-CM

## 2017-05-26 DIAGNOSIS — Z3493 Encounter for supervision of normal pregnancy, unspecified, third trimester: Secondary | ICD-10-CM

## 2017-05-26 NOTE — Patient Instructions (Addendum)
Third Trimester of Pregnancy The third trimester is from week 28 through week 40 (months 7 through 9). The third trimester is a time when the unborn baby (fetus) is growing rapidly. At the end of the ninth month, the fetus is about 20 inches in length and weighs 6-10 pounds. Body changes during your third trimester Your body will continue to go through many changes during pregnancy. The changes vary from woman to woman. During the third trimester:  Your weight will continue to increase. You can expect to gain 25-35 pounds (11-16 kg) by the end of the pregnancy.  You may begin to get stretch marks on your hips, abdomen, and breasts.  You may urinate more often because the fetus is moving lower into your pelvis and pressing on your bladder.  You may develop or continue to have heartburn. This is caused by increased hormones that slow down muscles in the digestive tract.  You may develop or continue to have constipation because increased hormones slow digestion and cause the muscles that push waste through your intestines to relax.  You may develop hemorrhoids. These are swollen veins (varicose veins) in the rectum that can itch or be painful.  You may develop swollen, bulging veins (varicose veins) in your legs.  You may have increased body aches in the pelvis, back, or thighs. This is due to weight gain and increased hormones that are relaxing your joints.  You may have changes in your hair. These can include thickening of your hair, rapid growth, and changes in texture. Some women also have hair loss during or after pregnancy, or hair that feels dry or thin. Your hair will most likely return to normal after your baby is born.  Your breasts will continue to grow and they will continue to become tender. A yellow fluid (colostrum) may leak from your breasts. This is the first milk you are producing for your baby.  Your belly button may stick out.  You may notice more swelling in your hands,  face, or ankles.  You may have increased tingling or numbness in your hands, arms, and legs. The skin on your belly may also feel numb.  You may feel short of breath because of your expanding uterus.  You may have more problems sleeping. This can be caused by the size of your belly, increased need to urinate, and an increase in your body's metabolism.  You may notice the fetus "dropping," or moving lower in your abdomen (lightening).  You may have increased vaginal discharge.  You may notice your joints feel loose and you may have pain around your pelvic bone.  What to expect at prenatal visits You will have prenatal exams every 2 weeks until week 36. Then you will have weekly prenatal exams. During a routine prenatal visit:  You will be weighed to make sure you and the baby are growing normally.  Your blood pressure will be taken.  Your abdomen will be measured to track your baby's growth.  The fetal heartbeat will be listened to.  Any test results from the previous visit will be discussed.  You may have a cervical check near your due date to see if your cervix has softened or thinned (effaced).  You will be tested for Group B streptococcus. This happens between 35 and 37 weeks.  Your health care provider may ask you:  What your birth plan is.  How you are feeling.  If you are feeling the baby move.  If you have had   any abnormal symptoms, such as leaking fluid, bleeding, severe headaches, or abdominal cramping.  If you are using any tobacco products, including cigarettes, chewing tobacco, and electronic cigarettes.  If you have any questions.  Other tests or screenings that may be performed during your third trimester include:  Blood tests that check for low iron levels (anemia).  Fetal testing to check the health, activity level, and growth of the fetus. Testing is done if you have certain medical conditions or if there are problems during the  pregnancy.  Nonstress test (NST). This test checks the health of your baby to make sure there are no signs of problems, such as the baby not getting enough oxygen. During this test, a belt is placed around your belly. The baby is made to move, and its heart rate is monitored during movement.  What is false labor? False labor is a condition in which you feel small, irregular tightenings of the muscles in the womb (contractions) that usually go away with rest, changing position, or drinking water. These are called Braxton Hicks contractions. Contractions may last for hours, days, or even weeks before true labor sets in. If contractions come at regular intervals, become more frequent, increase in intensity, or become painful, you should see your health care provider. What are the signs of labor?  Abdominal cramps.  Regular contractions that start at 10 minutes apart and become stronger and more frequent with time.  Contractions that start on the top of the uterus and spread down to the lower abdomen and back.  Increased pelvic pressure and dull back pain.  A watery or bloody mucus discharge that comes from the vagina.  Leaking of amniotic fluid. This is also known as your "water breaking." It could be a slow trickle or a gush. Let your health care provider know if it has a color or strange odor. If you have any of these signs, call your health care provider right away, even if it is before your due date. Follow these instructions at home: Medicines  Follow your health care provider's instructions regarding medicine use. Specific medicines may be either safe or unsafe to take during pregnancy.  Take a prenatal vitamin that contains at least 600 micrograms (mcg) of folic acid.  If you develop constipation, try taking a stool softener if your health care provider approves. Eating and drinking  Eat a balanced diet that includes fresh fruits and vegetables, whole grains, good sources of protein  such as meat, eggs, or tofu, and low-fat dairy. Your health care provider will help you determine the amount of weight gain that is right for you.  Avoid raw meat and uncooked cheese. These carry germs that can cause birth defects in the baby.  If you have low calcium intake from food, talk to your health care provider about whether you should take a daily calcium supplement.  Eat four or five small meals rather than three large meals a day.  Limit foods that are high in fat and processed sugars, such as fried and sweet foods.  To prevent constipation: ? Drink enough fluid to keep your urine clear or pale yellow. ? Eat foods that are high in fiber, such as fresh fruits and vegetables, whole grains, and beans. Activity  Exercise only as directed by your health care provider. Most women can continue their usual exercise routine during pregnancy. Try to exercise for 30 minutes at least 5 days a week. Stop exercising if you experience uterine contractions.  Avoid heavy   lifting.  Do not exercise in extreme heat or humidity, or at high altitudes.  Wear low-heel, comfortable shoes.  Practice good posture.  You may continue to have sex unless your health care provider tells you otherwise. Relieving pain and discomfort  Take frequent breaks and rest with your legs elevated if you have leg cramps or low back pain.  Take warm sitz baths to soothe any pain or discomfort caused by hemorrhoids. Use hemorrhoid cream if your health care provider approves.  Wear a good support bra to prevent discomfort from breast tenderness.  If you develop varicose veins: ? Wear support pantyhose or compression stockings as told by your healthcare provider. ? Elevate your feet for 15 minutes, 3-4 times a day. Prenatal care  Write down your questions. Take them to your prenatal visits.  Keep all your prenatal visits as told by your health care provider. This is important. Safety  Wear your seat belt at  all times when driving.  Make a list of emergency phone numbers, including numbers for family, friends, the hospital, and police and fire departments. General instructions  Avoid cat litter boxes and soil used by cats. These carry germs that can cause birth defects in the baby. If you have a cat, ask someone to clean the litter box for you.  Do not travel far distances unless it is absolutely necessary and only with the approval of your health care provider.  Do not use hot tubs, steam rooms, or saunas.  Do not drink alcohol.  Do not use any products that contain nicotine or tobacco, such as cigarettes and e-cigarettes. If you need help quitting, ask your health care provider.  Do not use any medicinal herbs or unprescribed drugs. These chemicals affect the formation and growth of the baby.  Do not douche or use tampons or scented sanitary pads.  Do not cross your legs for long periods of time.  To prepare for the arrival of your baby: ? Take prenatal classes to understand, practice, and ask questions about labor and delivery. ? Make a trial run to the hospital. ? Visit the hospital and tour the maternity area. ? Arrange for maternity or paternity leave through employers. ? Arrange for family and friends to take care of pets while you are in the hospital. ? Purchase a rear-facing car seat and make sure you know how to install it in your car. ? Pack your hospital bag. ? Prepare the baby's nursery. Make sure to remove all pillows and stuffed animals from the baby's crib to prevent suffocation.  Visit your dentist if you have not gone during your pregnancy. Use a soft toothbrush to brush your teeth and be gentle when you floss. Contact a health care provider if:  You are unsure if you are in labor or if your water has broken.  You become dizzy.  You have mild pelvic cramps, pelvic pressure, or nagging pain in your abdominal area.  You have lower back pain.  You have persistent  nausea, vomiting, or diarrhea.  You have an unusual or bad smelling vaginal discharge.  You have pain when you urinate. Get help right away if:  Your water breaks before 37 weeks.  You have regular contractions less than 5 minutes apart before 37 weeks.  You have a fever.  You are leaking fluid from your vagina.  You have spotting or bleeding from your vagina.  You have severe abdominal pain or cramping.  You have rapid weight loss or weight gain.    You have shortness of breath with chest pain.  You notice sudden or extreme swelling of your face, hands, ankles, feet, or legs.  Your baby makes fewer than 10 movements in 2 hours.  You have severe headaches that do not go away when you take medicine.  You have vision changes. Summary  The third trimester is from week 28 through week 40, months 7 through 9. The third trimester is a time when the unborn baby (fetus) is growing rapidly.  During the third trimester, your discomfort may increase as you and your baby continue to gain weight. You may have abdominal, leg, and back pain, sleeping problems, and an increased need to urinate.  During the third trimester your breasts will keep growing and they will continue to become tender. A yellow fluid (colostrum) may leak from your breasts. This is the first milk you are producing for your baby.  False labor is a condition in which you feel small, irregular tightenings of the muscles in the womb (contractions) that eventually go away. These are called Braxton Hicks contractions. Contractions may last for hours, days, or even weeks before true labor sets in.  Signs of labor can include: abdominal cramps; regular contractions that start at 10 minutes apart and become stronger and more frequent with time; watery or bloody mucus discharge that comes from the vagina; increased pelvic pressure and dull back pain; and leaking of amniotic fluid. This information is not intended to replace advice  given to you by your health care provider. Make sure you discuss any questions you have with your health care provider. Document Released: 09/28/2001 Document Revised: 03/11/2016 Document Reviewed: 12/05/2012 Elsevier Interactive Patient Education  2017 [employer] Contraception Choices Contraception (birth control) is the use of any methods or devices to prevent pregnancy. Below are some methods to help avoid pregnancy. Hormonal methods  Contraceptive implant. This is a thin, plastic tube containing progesterone hormone. It does not contain estrogen hormone. Your health care provider inserts the tube in the inner part of the upper arm. The tube can remain in place for up to 3 years. After 3 years, the implant must be removed. The implant prevents the ovaries from releasing an egg (ovulation), thickens the cervical mucus to prevent sperm from entering the uterus, and thins the lining of the inside of the uterus.  Progesterone-only injections. These injections are given every 3 months by your health care provider to prevent pregnancy. This synthetic progesterone hormone stops the ovaries from releasing eggs. It also thickens cervical mucus and changes the uterine lining. This makes it harder for sperm to survive in the uterus.  Birth control pills. These pills contain estrogen and progesterone hormone. They work by preventing the ovaries from releasing eggs (ovulation). They also cause the cervical mucus to thicken, preventing the sperm from entering the uterus. Birth control pills are prescribed by a health care provider.Birth control pills can also be used to treat heavy periods.  Minipill. This type of birth control pill contains only the progesterone hormone. They are taken every day of each month and must be prescribed by your health care provider.  Birth control patch. The patch contains hormones similar to those in birth control pills. It must be changed once a week and is prescribed by a  health care provider.  Vaginal ring. The ring contains hormones similar to those in birth control pills. It is left in the vagina for 3 weeks, removed for 1 week, and then a new one is put   back in place. The patient must be comfortable inserting and removing the ring from the vagina.A health care provider's prescription is necessary.  Emergency contraception. Emergency contraceptives prevent pregnancy after unprotected sexual intercourse. This pill can be taken right after sex or up to 5 days after unprotected sex. It is most effective the sooner you take the pills after having sexual intercourse. Most emergency contraceptive pills are available without a prescription. Check with your pharmacist. Do not use emergency contraception as your only form of birth control. Barrier methods  Female condom. This is a thin sheath (latex or rubber) that is worn over the penis during sexual intercourse. It can be used with spermicide to increase effectiveness.  Female condom. This is a soft, loose-fitting sheath that is put into the vagina before sexual intercourse.  Diaphragm. This is a soft, latex, dome-shaped barrier that must be fitted by a health care provider. It is inserted into the vagina, along with a spermicidal jelly. It is inserted before intercourse. The diaphragm should be left in the vagina for 6 to 8 hours after intercourse.  Cervical cap. This is a round, soft, latex or plastic cup that fits over the cervix and must be fitted by a health care provider. The cap can be left in place for up to 48 hours after intercourse.  Sponge. This is a soft, circular piece of polyurethane foam. The sponge has spermicide in it. It is inserted into the vagina after wetting it and before sexual intercourse.  Spermicides. These are chemicals that kill or block sperm from entering the cervix and uterus. They come in the form of creams, jellies, suppositories, foam, or tablets. They do not require a prescription. They  are inserted into the vagina with an applicator before having sexual intercourse. The process must be repeated every time you have sexual intercourse. Intrauterine contraception  Intrauterine device (IUD). This is a T-shaped device that is put in a woman's uterus during a menstrual period to prevent pregnancy. There are 2 types: ? Copper IUD. This type of IUD is wrapped in copper wire and is placed inside the uterus. Copper makes the uterus and fallopian tubes produce a fluid that kills sperm. It can stay in place for 10 years. ? Hormone IUD. This type of IUD contains the hormone progestin (synthetic progesterone). The hormone thickens the cervical mucus and prevents sperm from entering the uterus, and it also thins the uterine lining to prevent implantation of a fertilized egg. The hormone can weaken or kill the sperm that get into the uterus. It can stay in place for 3-5 years, depending on which type of IUD is used. Permanent methods of contraception  Female tubal ligation. This is when the woman's fallopian tubes are surgically sealed, tied, or blocked to prevent the egg from traveling to the uterus.  Hysteroscopic sterilization. This involves placing a small coil or insert into each fallopian tube. Your doctor uses a technique called hysteroscopy to do the procedure. The device causes scar tissue to form. This results in permanent blockage of the fallopian tubes, so the sperm cannot fertilize the egg. It takes about 3 months after the procedure for the tubes to become blocked. You must use another form of birth control for these 3 months.  Female sterilization. This is when the female has the tubes that carry sperm tied off (vasectomy).This blocks sperm from entering the vagina during sexual intercourse. After the procedure, the man can still ejaculate fluid (semen). Natural planning methods  Natural family planning.   This is not having sexual intercourse or using a barrier method (condom, diaphragm,  cervical cap) on days the woman could become pregnant.  Calendar method. This is keeping track of the length of each menstrual cycle and identifying when you are fertile.  Ovulation method. This is avoiding sexual intercourse during ovulation.  Symptothermal method. This is avoiding sexual intercourse during ovulation, using a thermometer and ovulation symptoms.  Post-ovulation method. This is timing sexual intercourse after you have ovulated. Regardless of which type or method of contraception you choose, it is important that you use condoms to protect against the transmission of sexually transmitted infections (STIs). Talk with your health care provider about which form of contraception is most appropriate for you. This information is not intended to replace advice given to you by your health care provider. Make sure you discuss any questions you have with your health care provider. Document Released: 10/04/2005 Document Revised: 03/11/2016 Document Reviewed: 03/29/2013 Elsevier Interactive Patient Education  2017 [employer] Inc[employer]  

## 2017-05-26 NOTE — Progress Notes (Signed)
36 week cultures today 

## 2017-05-26 NOTE — Progress Notes (Signed)
Patient ID: Kaitlin Jackson, female   DOB: [birth year], 26 y.o.   MRN: [id number]   PRENATAL VISIT NOTE  Subjective:  Kaitlin Jackson is a 26 y.o. G2P0010 at 485w3d being seen today for ongoing prenatal care.  She is currently monitored for the following issues for this low-risk pregnancy and has Supervision of normal pregnancy; Obesity in pregnancy, antepartum; Rubella non-immune status, antepartum; Marijuana use; Uterine size date discrepancy pregnancy, third trimester; and Anemia in pregnancy on her problem list.  Patient reports no complaints.  Contractions: Irritability. Vag. Bleeding: None.  Movement: Present. Denies leaking of fluid.   The following portions of the patient's history were reviewed and updated as appropriate: allergies, current medications, past family history, past medical history, past social history, past surgical history and problem list. Problem list updated.  Objective:   Vitals:   05/26/17 0809  BP: 129/74  Pulse: 86  Weight: (!) 324 lb (147 kg)    Fetal Status: Fetal Heart Rate (bpm): 129   Movement: Present     General:  Alert, oriented and cooperative. Patient is in no acute distress.  Skin: Skin is warm and dry. No rash noted.   Cardiovascular: Normal heart rate noted  Respiratory: Normal respiratory effort, no problems with respiration noted  Abdomen: Soft, gravid, appropriate for gestational age.  Pain/Pressure: Present     Pelvic: Cervical exam performed      FT/thick/firm FH 37.5cm, vertex by leopolds  Extremities: Normal range of motion.  Edema: None  Mental Status:  Normal mood and affect. Normal behavior. Normal judgment and thought content.   Assessment and Plan:  Pregnancy: G2P0010 at 5985w3d  1. Encounter for supervision of normal pregnancy in third trimester, unspecified gravidity  - Strep Gp B NAA - Cervicovaginal ancillary only  Term labor symptoms and general obstetric precautions including but not limited to vaginal bleeding,  contractions, leaking of fluid and fetal movement were reviewed in detail with the patient. Please refer to After Visit Summary for other counseling recommendations.  Return in about 1 week (around 06/02/2017).   Thressa ShellerHeather Ercelle Winkles, CNM

## 2017-05-27 ENCOUNTER — Encounter: Payer: Self-pay | Admitting: Family Medicine

## 2017-05-27 ENCOUNTER — Telehealth: Payer: Self-pay | Admitting: *Deleted

## 2017-05-27 NOTE — Telephone Encounter (Signed)
Patient left message, very broken up, difficult to understand.  Returned call, got voice mail. Advised that I am returning her call but could not understand the message. If the need is urgent, I advised her to go to [facility], if not and can wait until Monday she can call the clinic when it reopens.

## 2017-05-28 LAB — STREP GP B NAA: STREP GROUP B AG: POSITIVE — AB

## 2017-05-30 ENCOUNTER — Telehealth: Payer: Self-pay | Admitting: *Deleted

## 2017-05-30 ENCOUNTER — Encounter: Payer: Self-pay | Admitting: *Deleted

## 2017-05-30 LAB — CERVICOVAGINAL ANCILLARY ONLY
CHLAMYDIA, DNA PROBE: NEGATIVE
NEISSERIA GONORRHEA: NEGATIVE

## 2017-05-30 NOTE — Progress Notes (Signed)
Addendum:  05/30/17 FMLA papers completed.

## 2017-05-30 NOTE — Telephone Encounter (Signed)
Received FMLA paperwork with note first ones did not go through. I called Krystena to clarify if theh fax did not go thru or FMLA not approved.  She states they did not approve her first set, but did second set. States she needs these filled out. I explained these looked like basically the same paperwork. She states she put down her last day was 06/13/17. I explained for the paperwork we had to put medically we put down her expected due date 06/20/17 and she will be out for approximately 6 weeks when she delivers. I explained the 12 weeks FMLA, when it starts is betweeen her and her employer if she wants to go out before her due date.  If something changes medically and we take her out early, then we would adjust her medical leave. I explained I would fill out the paperwork again, but it will have same dates as before. She voices understanding.

## 2017-06-06 ENCOUNTER — Ambulatory Visit (INDEPENDENT_AMBULATORY_CARE_PROVIDER_SITE_OTHER): Payer: 59 | Admitting: Family Medicine

## 2017-06-06 VITALS — BP 132/80 | HR 98 | Wt 328.5 lb

## 2017-06-06 DIAGNOSIS — O09899 Supervision of other high risk pregnancies, unspecified trimester: Secondary | ICD-10-CM

## 2017-06-06 DIAGNOSIS — Z2839 Other underimmunization status: Secondary | ICD-10-CM

## 2017-06-06 DIAGNOSIS — O99820 Streptococcus B carrier state complicating pregnancy: Secondary | ICD-10-CM

## 2017-06-06 DIAGNOSIS — Z3403 Encounter for supervision of normal first pregnancy, third trimester: Secondary | ICD-10-CM

## 2017-06-06 DIAGNOSIS — O99013 Anemia complicating pregnancy, third trimester: Secondary | ICD-10-CM

## 2017-06-06 DIAGNOSIS — D649 Anemia, unspecified: Secondary | ICD-10-CM

## 2017-06-06 DIAGNOSIS — O99213 Obesity complicating pregnancy, third trimester: Secondary | ICD-10-CM

## 2017-06-06 DIAGNOSIS — O9989 Other specified diseases and conditions complicating pregnancy, childbirth and the puerperium: Secondary | ICD-10-CM

## 2017-06-06 DIAGNOSIS — Z283 Underimmunization status: Secondary | ICD-10-CM

## 2017-06-06 DIAGNOSIS — O99210 Obesity complicating pregnancy, unspecified trimester: Secondary | ICD-10-CM

## 2017-06-06 NOTE — Progress Notes (Signed)
Breastfeeding ed done

## 2017-06-06 NOTE — Progress Notes (Signed)
   PRENATAL VISIT NOTE  Subjective:  Kaitlin Jackson is a 26 y.o. G2P0010 at [redacted]w[redacted]d being seen today for ongoing prenatal care.  She is currently monitored for the following issues for this low-risk pregnancy and has Supervision of normal pregnancy; Obesity in pregnancy, antepartum; Rubella non-immune status, antepartum; Marijuana use; Uterine size date discrepancy pregnancy, third trimester; Anemia in pregnancy; and Group B streptococcal carriage complicating pregnancy on her problem list.  Patient reports no complaints other than irregular contractions.  Contractions: Irregular. Vag. Bleeding: None.  Movement: Present. Denies leaking of fluid.   The following portions of the patient's history were reviewed and updated as appropriate: allergies, current medications, past family history, past medical history, past social history, past surgical history and problem list. Problem list updated.  Objective:   Vitals:   06/06/17 0906  BP: 132/80  Pulse: 98  Weight: (!) 328 lb 8 oz (149 kg)    Fetal Status: Fetal Heart Rate (bpm): 141   Movement: Present  Presentation: Vertex  General:  Alert, oriented and cooperative. Patient is in no acute distress.  Skin: Skin is warm and dry. No rash noted.   Cardiovascular: Normal heart rate noted  Respiratory: Normal respiratory effort, no problems with respiration noted  Abdomen: Soft, gravid, appropriate for gestational age.  Pain/Pressure: Present     Pelvic: Cervical exam performed Dilation: Fingertip      Extremities: Normal range of motion.  Edema: None  Mental Status:  Normal mood and affect. Normal behavior. Normal judgment and thought content.   Assessment and Plan:  Pregnancy: G2P0010 at [redacted]w[redacted]d  1. Encounter for supervision of normal first pregnancy in third trimester - Labor precautions and ROM precautions reviewed - Follow up in 1 week  2. Obesity in pregnancy, antepartum - EFW 66%ile at 31 weeks  3. Rubella non-immune status,  antepartum - Vaccinate postpartum  4. Anemia during pregnancy in third trimester - Hgb 10.2 on 03/30/17. Continue ferrous sulfate  5. Group B streptococcal carriage complicating pregnancy - Will need IAP when in active labor or at ROM  Term labor symptoms and general obstetric precautions including but not limited to vaginal bleeding, contractions, leaking of fluid and fetal movement were reviewed in detail with the patient. Please refer to After Visit Summary for other counseling recommendations.  Return in about 1 week (around 06/13/2017).   Raynelle Fanning P. Degele, MD OB Fellow  Future Appointments Date Time Provider Department Center  06/13/2017 2:40 PM Constant, Gigi Gin, MD [facility]

## 2017-06-12 ENCOUNTER — Inpatient Hospital Stay (HOSPITAL_COMMUNITY)
Admission: AD | Admit: 2017-06-12 | Discharge: 2017-06-12 | Disposition: A | Discharge status: Home / Self Care | Payer: 59 | Source: Ambulatory Visit | Attending: Obstetrics and Gynecology | Admitting: Obstetrics and Gynecology

## 2017-06-12 ENCOUNTER — Encounter (HOSPITAL_COMMUNITY): Payer: Self-pay

## 2017-06-12 DIAGNOSIS — O479 False labor, unspecified: Secondary | ICD-10-CM

## 2017-06-12 DIAGNOSIS — Z3A36 36 weeks gestation of pregnancy: Secondary | ICD-10-CM | POA: Insufficient documentation

## 2017-06-12 DIAGNOSIS — Z87891 Personal history of nicotine dependence: Secondary | ICD-10-CM | POA: Diagnosis not present

## 2017-06-12 DIAGNOSIS — O26893 Other specified pregnancy related conditions, third trimester: Secondary | ICD-10-CM | POA: Insufficient documentation

## 2017-06-12 DIAGNOSIS — R0989 Other specified symptoms and signs involving the circulatory and respiratory systems: Secondary | ICD-10-CM

## 2017-06-12 DIAGNOSIS — R030 Elevated blood-pressure reading, without diagnosis of hypertension: Secondary | ICD-10-CM | POA: Insufficient documentation

## 2017-06-12 LAB — COMPREHENSIVE METABOLIC PANEL
ALK PHOS: 147 U/L — AB (ref 38–126)
ALT: 18 U/L (ref 14–54)
ANION GAP: 8 (ref 5–15)
AST: 20 U/L (ref 15–41)
Albumin: 2.5 g/dL — ABNORMAL LOW (ref 3.5–5.0)
BUN: 8 mg/dL (ref 6–20)
CALCIUM: 8.9 mg/dL (ref 8.9–10.3)
CHLORIDE: 106 mmol/L (ref 101–111)
CO2: 20 mmol/L — ABNORMAL LOW (ref 22–32)
CREATININE: 0.63 mg/dL (ref 0.44–1.00)
Glucose, Bld: 95 mg/dL (ref 65–99)
Potassium: 4 mmol/L (ref 3.5–5.1)
Sodium: 134 mmol/L — ABNORMAL LOW (ref 135–145)
Total Bilirubin: 0.4 mg/dL (ref 0.3–1.2)
Total Protein: 6.4 g/dL — ABNORMAL LOW (ref 6.5–8.1)

## 2017-06-12 LAB — CBC
HCT: 30.5 % — ABNORMAL LOW (ref 36.0–46.0)
Hemoglobin: 10.3 g/dL — ABNORMAL LOW (ref 12.0–15.0)
MCH: 28.5 pg (ref 26.0–34.0)
MCHC: 33.8 g/dL (ref 30.0–36.0)
MCV: 84.3 fL (ref 78.0–100.0)
PLATELETS: 253 10*3/uL (ref 150–400)
RBC: 3.62 MIL/uL — AB (ref 3.87–5.11)
RDW: 14.6 % (ref 11.5–15.5)
WBC: 12.9 10*3/uL — ABNORMAL HIGH (ref 4.0–10.5)

## 2017-06-12 LAB — PROTEIN / CREATININE RATIO, URINE
CREATININE, URINE: 267 mg/dL
Protein Creatinine Ratio: 0.11 mg/mg{Cre} (ref 0.00–0.15)
TOTAL PROTEIN, URINE: 29 mg/dL

## 2017-06-12 NOTE — [facility] Note (Signed)
Pt reports contractions every 3-5 mins apart. Pt denies LOF or vaginal bleeding. Reports some mucousy discharge. Good fetal movement. States she was 1cm on last exam.

## 2017-06-12 NOTE — [facility] Provider Note (Signed)
Chief Complaint:  Labor Eval  Provider saw patient at 0430    HPI: Kaitlin Jackson is a 26 y.o. G2P0010 at 17w6dwho presents to maternity admissions reporting uterine contractions. I was asked to see her later because she had some borderline elevations in systolic BP. Marland Kitchen She reports good fetal movement, denies LOF, vaginal bleeding, vaginal itching/burning, urinary symptoms, h/a, dizziness, n/v, diarrhea, constipation or fever/chills.  She denies headache, visual changes or RUQ abdominal pain.  Abdominal Pain  This is a new problem. The current episode started today. The onset quality is gradual. The problem occurs intermittently. The problem has been waxing and waning. The pain is mild. The quality of the pain is cramping. The abdominal pain does not radiate. Pertinent negatives include no constipation, diarrhea, fever, headaches, myalgias, nausea or vomiting. Nothing aggravates the pain. The pain is relieved by nothing. She has tried nothing for the symptoms.   RN Note: Pt reports contractions every 3-5 mins apart. Pt denies LOF or vaginal bleeding. Reports some mucousy discharge. Good fetal movement. States she was 1cm on last exam.  Past Medical History: Past Medical History:  Diagnosis Date  . Abnormal Pap smear   . Chlamydia   . Kidney stones   . Obesity   . Renal disorder   . Vaginal Pap smear, abnormal     Past obstetric history: OB History  Gravida Para Term Preterm AB Living  2 0     1    SAB TAB Ectopic Multiple Live Births  1            # Outcome Date GA Lbr Len/2nd Weight Sex Delivery Anes PTL Lv  2 Current           1 SAB               Past Surgical History: Past Surgical History:  Procedure Laterality Date  . NO PAST SURGERIES      Family History: Family History  Problem Relation Age of Onset  . Miscarriages / [place] Mother   . Hypertension Mother   . Asthma Brother   . Diabetes Paternal Grandmother     Social History: Social History   Substance Use Topics  . Smoking status: Former Smoker    Packs/day: 0.50    Types: Cigarettes  . Smokeless tobacco: Never Used  . Alcohol use No    Allergies: No Known Allergies  Meds:  Prescriptions Prior to Admission  Medication Sig Dispense Refill Last Dose  . Prenatal Vit-Fe Fumarate-FA (PREPLUS) 27-1 MG TABS Take 1 tablet by mouth daily. 30 tablet 13 06/11/2017 at Unknown time    I have reviewed patient's Past Medical Hx, Surgical Hx, Family Hx, Social Hx, medications and allergies.   ROS:  Review of Systems  Constitutional: Negative for fever.  Gastrointestinal: Positive for abdominal pain. Negative for constipation, diarrhea, nausea and vomiting.  Musculoskeletal: Negative for myalgias.  Neurological: Negative for headaches.   Other systems negative  Physical Exam  Patient Vitals for the past 24 hrs:  BP Temp Temp src Pulse Resp SpO2 Height Weight  06/12/17 0400 140/82 - - 80 - - - -  06/12/17 0345 (!) 144/80 - - 79 - - - -  06/12/17 0315 (!) 146/78 - - 84 - 100 % - -  06/12/17 0300 139/80 - - 85 - - - -  06/12/17 0247 (!) 143/75 98.1 F (36.7 C) Oral 86 19 100 % - -  06/12/17 1610 - - - - - -  5\' 7"  (1.702 m) (!) 333 lb (151 kg)   Constitutional: Well-developed, well-nourished female in no acute distress.  Cardiovascular: normal rate and rhythm Respiratory: normal effort, clear to auscultation bilaterally GI: Abd soft, non-tender, gravid appropriate for gestational age.   No rebound or guarding. MS: Extremities nontender, no edema, normal ROM Neurologic: Alert and oriented x 4. DTRs 1+/no clonus GU: Neg CVAT.  PELVIC EXAM: Dilation: Fingertip Effacement (%): 70 Cervical Position: Posterior Station: -3 Presentation: Undeterminable Exam by:: benji [email or web address]  FHT:  Baseline 130 , moderate variability, accelerations present, no decelerations Contractions:  Irregular     Labs: Results for orders placed or performed during the hospital encounter of 06/12/17  (from the past 24 hour(s))  Protein / creatinine ratio, urine     Status: None   Collection Time: 06/12/17  3:20 AM  Result Value Ref Range   Creatinine, Urine 267.00 mg/dL   Total Protein, Urine 29 mg/dL   Protein Creatinine Ratio 0.11 0.00 - 0.15 mg/mg[Cre]  CBC     Status: Abnormal   Collection Time: 06/12/17  3:28 AM  Result Value Ref Range   WBC 12.9 (H) 4.0 - 10.5 K/uL   RBC 3.62 (L) 3.87 - 5.11 MIL/uL   Hemoglobin 10.3 (L) 12.0 - 15.0 g/dL   HCT 03.5 (L) 00.9 - 38.1 %   MCV 84.3 78.0 - 100.0 fL   MCH 28.5 26.0 - 34.0 pg   MCHC 33.8 30.0 - 36.0 g/dL   RDW 82.9 93.7 - 16.9 %   Platelets 253 150 - 400 K/uL  Comprehensive metabolic panel     Status: Abnormal   Collection Time: 06/12/17  3:28 AM  Result Value Ref Range   Sodium 134 (L) 135 - 145 mmol/L   Potassium 4.0 3.5 - 5.1 mmol/L   Chloride 106 101 - 111 mmol/L   CO2 20 (L) 22 - 32 mmol/L   Glucose, Bld 95 65 - 99 mg/dL   BUN 8 6 - 20 mg/dL   Creatinine, Ser 6.78 0.44 - 1.00 mg/dL   Calcium 8.9 8.9 - 93.8 mg/dL   Total Protein 6.4 (L) 6.5 - 8.1 g/dL   Albumin 2.5 (L) 3.5 - 5.0 g/dL   AST 20 15 - 41 U/L   ALT 18 14 - 54 U/L   Alkaline Phosphatase 147 (H) 38 - 126 U/L   Total Bilirubin 0.4 0.3 - 1.2 mg/dL   GFR calc non Af Amer >60 >60 mL/min   GFR calc Af Amer >60 >60 mL/min   Anion gap 8 5 - 15   A/POS/-- (02/12 1016)  Imaging:  No results found.  MAU Course/MDM: I have ordered labs and reviewed results.  NST reviewed and found to be reactive  Discussed borderline BPs Has appt Monday afternoon, will recheck BP and if elevated, would meet criteria for IOL    Assessment: S[place]gle IUP at [redacted]w[redacted]d Borderline elevated systolic BPs  Normal PIH labs   Plan: Discharge home Strict preeclampsia precautions Labor precautions and fetal kick counts Follow up in Office for prenatal visits and recheck of BP (appt 1440 Monday)  Encouraged to return here or to other Urgent Care/ED if she develops worsening of symptoms,  increase in pain, fever, or other concerning symptoms.   Pt stable at time of discharge.  Wynelle Bourgeois CNM, MSN Certified Nurse-Midwife 06/12/2017 4:39 AM

## 2017-06-12 NOTE — Discharge Instructions (Signed)
Hypertension During Pregnancy °Hypertension, commonly called high blood pressure, is when the force of blood pumping through your arteries is too strong. Arteries are blood vessels that carry blood from the heart throughout the body. Hypertension during pregnancy can cause problems for you and your baby. Your baby may be born early (prematurely) or may not weigh as much as he or she should at birth. Very bad cases of hypertension during pregnancy can be life-threatening. °Different types of hypertension can occur during pregnancy. These include: °· Chronic hypertension. This happens when: °? You have hypertension before pregnancy and it continues during pregnancy. °? You develop hypertension before you are [redacted] weeks pregnant, and it continues during pregnancy. °· Gestational hypertension. This is hypertension that develops after the 20th week of pregnancy. °· Preeclampsia, also called toxemia of pregnancy. This is a very serious type of hypertension that develops only during pregnancy. It affects the whole body, and it can be very dangerous for you and your baby. ° °Gestational hypertension and preeclampsia usually go away within 6 weeks after your baby is born. Women who have hypertension during pregnancy have a greater chance of developing hypertension later in life or during future pregnancies. °What are the causes? °The exact cause of hypertension is not known. °What increases the risk? °There are certain factors that make it more likely for you to develop hypertension during pregnancy. These include: °· Having hypertension during a previous pregnancy or prior to pregnancy. °· Being overweight. °· Being older than age 40. °· Being pregnant for the first time or being pregnant with more than one baby. °· Becoming pregnant using fertilization methods such as IVF (in vitro fertilization). °· Having diabetes, kidney problems, or systemic lupus erythematosus. °· Having a family history of hypertension. ° °What are the  signs or symptoms? °Chronic hypertension and gestational hypertension rarely cause symptoms. Preeclampsia causes symptoms, which may include: °· Increased protein in your urine. Your health care provider will check for this at every visit before you give birth (prenatal visit). °· Severe headaches. °· Sudden weight gain. °· Swelling of the hands, face, legs, and feet. °· Nausea and vomiting. °· Vision problems, such as blurred or double vision. °· Numbness in the face, arms, legs, and feet. °· Dizziness. °· Slurred speech. °· Sensitivity to bright lights. °· Abdominal pain. °· Convulsions. ° °How is this diagnosed? °You may be diagnosed with hypertension during a routine prenatal exam. At each prenatal visit, you may: °· Have a urine test to check for high amounts of protein in your urine. °· Have your blood pressure checked. A blood pressure reading is recorded as two numbers, such as "120 over 80" (or 120/80). The first ("top") number is called the systolic pressure. It is a measure of the pressure in your arteries when your heart beats. The second ("bottom") number is called the diastolic pressure. It is a measure of the pressure in your arteries as your heart relaxes between beats. Blood pressure is measured in a unit called mm Hg. A normal blood pressure reading is: °? Systolic: below 120. °? Diastolic: below 80. ° °The type of hypertension that you are diagnosed with depends on your test results and when your symptoms developed. °· Chronic hypertension is usually diagnosed before 20 weeks of pregnancy. °· Gestational hypertension is usually diagnosed after 20 weeks of pregnancy. °· Hypertension with high amounts of protein in the urine is diagnosed as preeclampsia. °· Blood pressure measurements that stay above 160 systolic, or above 110 diastolic, are   signs of severe preeclampsia. ° °How is this treated? °Treatment for hypertension during pregnancy varies depending on the type of hypertension you have and how  serious it is. °· If you take medicines called ACE inhibitors to treat chronic hypertension, you may need to switch medicines. ACE inhibitors should not be taken during pregnancy. °· If you have gestational hypertension, you may need to take blood pressure medicine. °· If you are at risk for preeclampsia, your health care provider may recommend that you take a low-dose aspirin every day to prevent high blood pressure during your pregnancy. °· If you have severe preeclampsia, you may need to be hospitalized so you and your baby can be monitored closely. You may also need to take medicine (magnesium sulfate) to prevent seizures and to lower blood pressure. This medicine may be given as an injection or through an IV tube. °· In some cases, if your condition gets worse, you may need to deliver your baby early. ° °Follow these instructions at home: °Eating and drinking °· Drink enough fluid to keep your urine clear or pale yellow. °· Eat a healthy diet that is low in salt (sodium). Do not add salt to your food. Check food labels to see how much sodium a food or beverage contains. °Lifestyle °· Do not use any products that contain nicotine or tobacco, such as cigarettes and e-cigarettes. If you need help quitting, ask your health care provider. °· Do not use alcohol. °· Avoid caffeine. °· Avoid stress as much as possible. Rest and get plenty of sleep. °General instructions °· Take over-the-counter and prescription medicines only as told by your health care provider. °· While lying down, lie on your left side. This keeps pressure off your baby. °· While sitting or lying down, raise (elevate) your feet. Try putting some pillows under your lower legs. °· Exercise regularly. Ask your health care provider what kinds of exercise are best for you. °· Keep all prenatal and follow-up visits as told by your health care provider. This is important. °Contact a health care provider if: °· You have symptoms that your health care  provider told you may require more treatment or monitoring, such as: °? Fever. °? Vomiting. °? Headache. °Get help right away if: °· You have severe abdominal pain or vomiting that does not get better with treatment. °· You suddenly develop swelling in your hands, ankles, or face. °· You gain 4 lbs (1.8 kg) or more in 1 week. °· You develop vaginal bleeding, or you have blood in your urine. °· You do not feel your baby moving as much as usual. °· You have blurred or double vision. °· You have muscle twitching or sudden tightening (spasms). °· You have shortness of breath. °· Your lips or fingernails turn blue. °This information is not intended to replace advice given to you by your health care provider. Make sure you discuss any questions you have with your health care provider. °Document Released: 06/22/2011 Document Revised: 04/23/2016 Document Reviewed: 03/19/2016 °Elsevier Interactive Patient Education © 2018 [employer] ° °

## 2017-06-13 ENCOUNTER — Ambulatory Visit (INDEPENDENT_AMBULATORY_CARE_PROVIDER_SITE_OTHER): Payer: 59 | Admitting: Obstetrics and Gynecology

## 2017-06-13 VITALS — BP 123/77 | HR 103 | Wt 329.0 lb

## 2017-06-13 DIAGNOSIS — O99210 Obesity complicating pregnancy, unspecified trimester: Secondary | ICD-10-CM

## 2017-06-13 DIAGNOSIS — O9989 Other specified diseases and conditions complicating pregnancy, childbirth and the puerperium: Secondary | ICD-10-CM

## 2017-06-13 DIAGNOSIS — Z3403 Encounter for supervision of normal first pregnancy, third trimester: Secondary | ICD-10-CM

## 2017-06-13 DIAGNOSIS — Z283 Underimmunization status: Secondary | ICD-10-CM

## 2017-06-13 DIAGNOSIS — O99820 Streptococcus B carrier state complicating pregnancy: Secondary | ICD-10-CM

## 2017-06-13 DIAGNOSIS — O09899 Supervision of other high risk pregnancies, unspecified trimester: Secondary | ICD-10-CM

## 2017-06-13 DIAGNOSIS — Z2839 Other underimmunization status: Secondary | ICD-10-CM

## 2017-06-13 NOTE — Progress Notes (Signed)
   PRENATAL VISIT NOTE  Subjective:  Kaitlin Jackson is a 26 y.o. G2P0010 at [redacted]w[redacted]d being seen today for ongoing prenatal care.  She is currently monitored for the following issues for this low-risk pregnancy and has Supervision of normal pregnancy; Obesity in pregnancy, antepartum; Rubella non-immune status, antepartum; Marijuana use; Uterine size date discrepancy pregnancy, third trimester; Anemia in pregnancy; and Group B streptococcal carriage complicating pregnancy on her problem list.  Patient reports no complaints.  Contractions: Irregular. Vag. Bleeding: None.  Movement: Present. Denies leaking of fluid.   The following portions of the patient's history were reviewed and updated as appropriate: allergies, current medications, past family history, past medical history, past social history, past surgical history and problem list. Problem list updated.  Objective:   Vitals:   06/13/17 1449  BP: 123/77  Pulse: (!) 103  Weight: (!) 329 lb (149.2 kg)    Fetal Status: Fetal Heart Rate (bpm): 145 Fundal Height: 40 cm Movement: Present     General:  Alert, oriented and cooperative. Patient is in no acute distress.  Skin: Skin is warm and dry. No rash noted.   Cardiovascular: Normal heart rate noted  Respiratory: Normal respiratory effort, no problems with respiration noted  Abdomen: Soft, gravid, appropriate for gestational age.  Pain/Pressure: Present     Pelvic: Cervical exam deferred        Extremities: Normal range of motion.  Edema: None  Mental Status:  Normal mood and affect. Normal behavior. Normal judgment and thought content.   Assessment and Plan:  Pregnancy: G2P0010 at 100w0d  1. Group B streptococcal carriage complicating pregnancy Will provide prophylaxis in labor  2. Encounter for supervision of normal first pregnancy in third trimester Patient is doing well Dicussed postdate IOL at 41 weeks Fetal testing to start after 40 weeks  3. Obesity in pregnancy,  antepartum   4. Rubella non-immune status, antepartum Will offer pp  Term labor symptoms and general obstetric precautions including but not limited to vaginal bleeding, contractions, leaking of fluid and fetal movement were reviewed in detail with the patient. Please refer to After Visit Summary for other counseling recommendations.  Return in about 1 week (around 06/20/2017) for ROB, NST, AFI.   Catalina Antigua, MD

## 2017-06-14 ENCOUNTER — Telehealth (HOSPITAL_COMMUNITY): Payer: Self-pay | Admitting: *Deleted

## 2017-06-14 ENCOUNTER — Encounter (HOSPITAL_COMMUNITY): Payer: Self-pay | Admitting: *Deleted

## 2017-06-14 NOTE — Telephone Encounter (Signed)
Preadmission screen  

## 2017-06-15 ENCOUNTER — Telehealth: Payer: Self-pay | Admitting: General Practice

## 2017-06-15 NOTE — Telephone Encounter (Signed)
Patient called and left message stating she has questions about her FMLA papers. Patient states the certification of healthcare provider form wasn't sent in. Patient wants to know if she can pick the form up or if it can be refaxed. Called patient and she states she already came by and had the problem taken care of. Patient had no questions

## 2017-06-21 ENCOUNTER — Ambulatory Visit: Payer: Self-pay

## 2017-06-21 ENCOUNTER — Ambulatory Visit (INDEPENDENT_AMBULATORY_CARE_PROVIDER_SITE_OTHER): Payer: 59 | Admitting: *Deleted

## 2017-06-21 ENCOUNTER — Encounter (HOSPITAL_COMMUNITY): Payer: Self-pay | Admitting: *Deleted

## 2017-06-21 ENCOUNTER — Ambulatory Visit (INDEPENDENT_AMBULATORY_CARE_PROVIDER_SITE_OTHER): Payer: 59 | Admitting: Advanced Practice Midwife

## 2017-06-21 ENCOUNTER — Inpatient Hospital Stay (HOSPITAL_COMMUNITY)
Admission: AD | Admit: 2017-06-21 | Discharge: 2017-06-24 | DRG: 775 | Disposition: A | Discharge status: Home / Self Care | Payer: 59 | Source: Ambulatory Visit | Attending: Family Medicine | Admitting: Family Medicine

## 2017-06-21 VITALS — BP 139/82 | HR 75

## 2017-06-21 DIAGNOSIS — O99214 Obesity complicating childbirth: Secondary | ICD-10-CM | POA: Diagnosis present

## 2017-06-21 DIAGNOSIS — O9902 Anemia complicating childbirth: Secondary | ICD-10-CM | POA: Diagnosis present

## 2017-06-21 DIAGNOSIS — O480 Post-term pregnancy: Secondary | ICD-10-CM | POA: Diagnosis not present

## 2017-06-21 DIAGNOSIS — O134 Gestational [pregnancy-induced] hypertension without significant proteinuria, complicating childbirth: Secondary | ICD-10-CM | POA: Diagnosis not present

## 2017-06-21 DIAGNOSIS — Z3490 Encounter for supervision of normal pregnancy, unspecified, unspecified trimester: Secondary | ICD-10-CM

## 2017-06-21 DIAGNOSIS — Z6841 Body Mass Index (BMI) 40.0 and over, adult: Secondary | ICD-10-CM | POA: Diagnosis not present

## 2017-06-21 DIAGNOSIS — Z1379 Encounter for other screening for genetic and chromosomal anomalies: Secondary | ICD-10-CM

## 2017-06-21 DIAGNOSIS — O99210 Obesity complicating pregnancy, unspecified trimester: Secondary | ICD-10-CM

## 2017-06-21 DIAGNOSIS — Z3A40 40 weeks gestation of pregnancy: Secondary | ICD-10-CM | POA: Diagnosis not present

## 2017-06-21 DIAGNOSIS — D649 Anemia, unspecified: Secondary | ICD-10-CM | POA: Diagnosis present

## 2017-06-21 DIAGNOSIS — O99824 Streptococcus B carrier state complicating childbirth: Secondary | ICD-10-CM | POA: Diagnosis not present

## 2017-06-21 DIAGNOSIS — O99324 Drug use complicating childbirth: Secondary | ICD-10-CM | POA: Diagnosis present

## 2017-06-21 DIAGNOSIS — O139 Gestational [pregnancy-induced] hypertension without significant proteinuria, unspecified trimester: Secondary | ICD-10-CM | POA: Diagnosis present

## 2017-06-21 DIAGNOSIS — O26843 Uterine size-date discrepancy, third trimester: Secondary | ICD-10-CM | POA: Diagnosis present

## 2017-06-21 DIAGNOSIS — O9989 Other specified diseases and conditions complicating pregnancy, childbirth and the puerperium: Secondary | ICD-10-CM

## 2017-06-21 DIAGNOSIS — F1290 Cannabis use, unspecified, uncomplicated: Secondary | ICD-10-CM | POA: Diagnosis present

## 2017-06-21 DIAGNOSIS — Z87891 Personal history of nicotine dependence: Secondary | ICD-10-CM | POA: Diagnosis not present

## 2017-06-21 DIAGNOSIS — Z2839 Other underimmunization status: Secondary | ICD-10-CM

## 2017-06-21 DIAGNOSIS — O99013 Anemia complicating pregnancy, third trimester: Secondary | ICD-10-CM

## 2017-06-21 DIAGNOSIS — Z283 Underimmunization status: Secondary | ICD-10-CM

## 2017-06-21 LAB — POCT URINALYSIS DIP (DEVICE)
Glucose, UA: NEGATIVE mg/dL
HGB URINE DIPSTICK: NEGATIVE
Ketones, ur: NEGATIVE mg/dL
NITRITE: NEGATIVE
PH: 6.5 (ref 5.0–8.0)
Protein, ur: NEGATIVE mg/dL
Urobilinogen, UA: 0.2 mg/dL (ref 0.0–1.0)

## 2017-06-21 LAB — CBC
HCT: 32.6 % — ABNORMAL LOW (ref 36.0–46.0)
Hemoglobin: 10.9 g/dL — ABNORMAL LOW (ref 12.0–15.0)
MCH: 28.2 pg (ref 26.0–34.0)
MCHC: 33.4 g/dL (ref 30.0–36.0)
MCV: 84.2 fL (ref 78.0–100.0)
PLATELETS: 242 10*3/uL (ref 150–400)
RBC: 3.87 MIL/uL (ref 3.87–5.11)
RDW: 14.7 % (ref 11.5–15.5)
WBC: 13 10*3/uL — AB (ref 4.0–10.5)

## 2017-06-21 LAB — TYPE AND SCREEN
ABO/RH(D): A POS
Antibody Screen: NEGATIVE

## 2017-06-21 LAB — COMPREHENSIVE METABOLIC PANEL
ALT: 14 U/L (ref 14–54)
ANION GAP: 9 (ref 5–15)
AST: 20 U/L (ref 15–41)
Albumin: 2.6 g/dL — ABNORMAL LOW (ref 3.5–5.0)
Alkaline Phosphatase: 160 U/L — ABNORMAL HIGH (ref 38–126)
BUN: 7 mg/dL (ref 6–20)
CHLORIDE: 106 mmol/L (ref 101–111)
CO2: 20 mmol/L — AB (ref 22–32)
Calcium: 9.1 mg/dL (ref 8.9–10.3)
Creatinine, Ser: 0.54 mg/dL (ref 0.44–1.00)
GFR calc non Af Amer: >60 mL/min (ref >60)
Glucose, Bld: 93 mg/dL (ref 65–99)
POTASSIUM: 3.9 mmol/L (ref 3.5–5.1)
SODIUM: 135 mmol/L (ref 135–145)
Total Bilirubin: 0.4 mg/dL (ref 0.3–1.2)
Total Protein: 6.9 g/dL (ref 6.5–8.1)

## 2017-06-21 IMAGING — US US MFM OB DETAIL+14 WK
1 series · 14 of 28 positions shown · non-contrast
Comparison: none

[Series 1: us mfm ob detail+14 wk · 74 acquisitions, 14 frames shown]
[im 3/74]
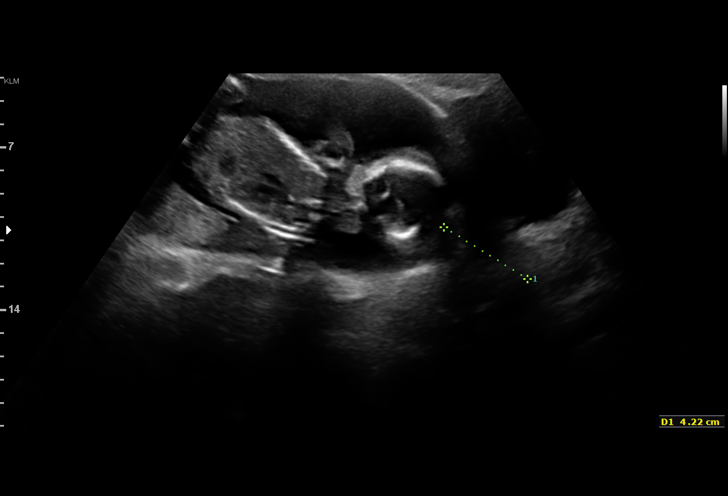
[im 9/74]
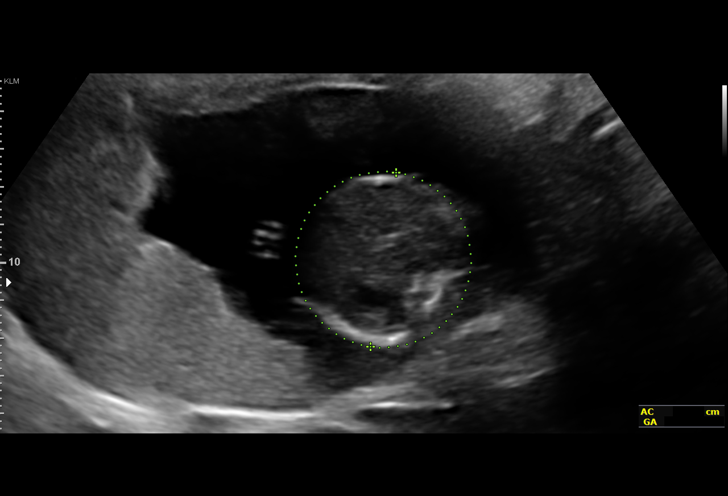
[im 14/74]
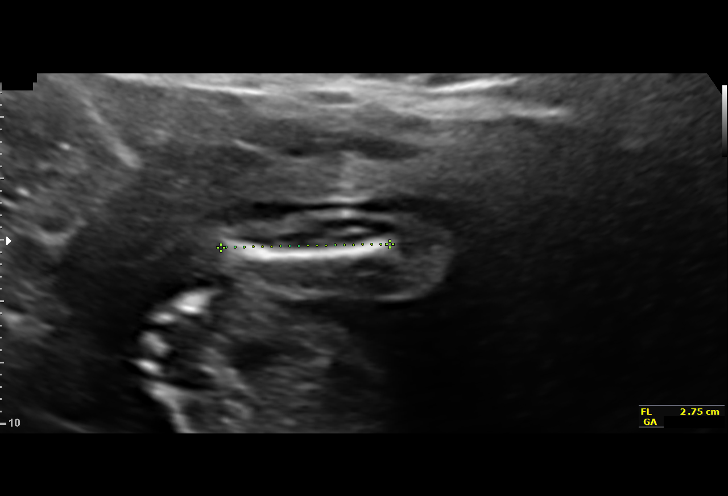
[im 19/74]
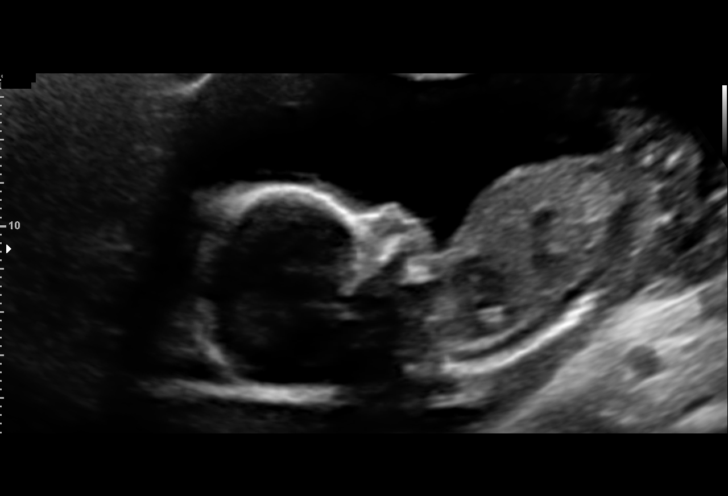
[im 25/74]
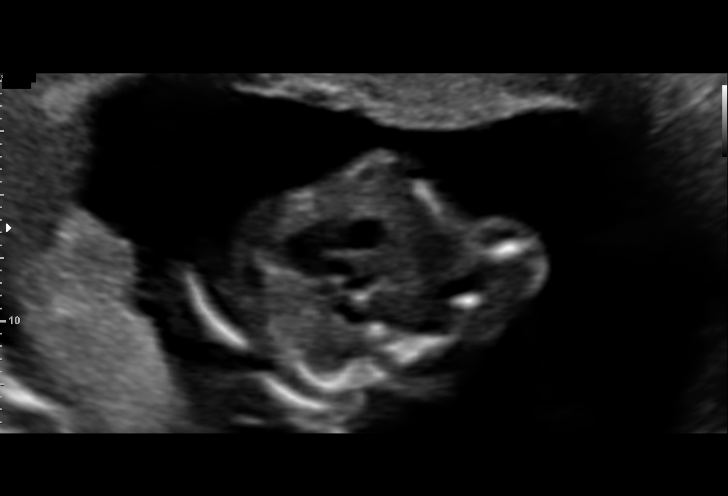
[im 30/74]
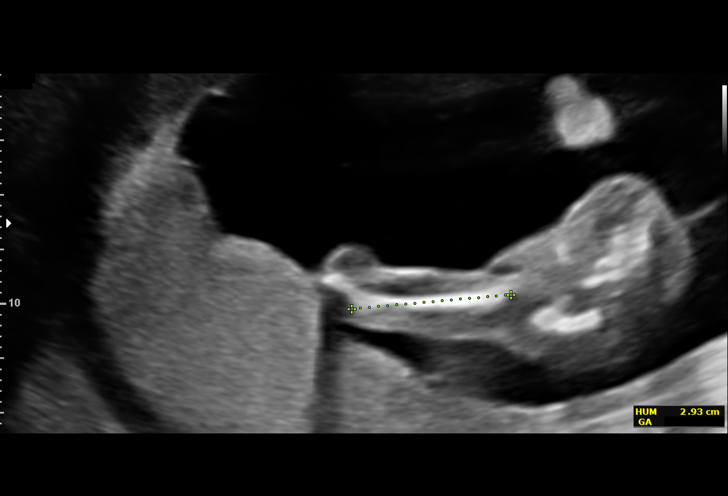
[im 36/74]
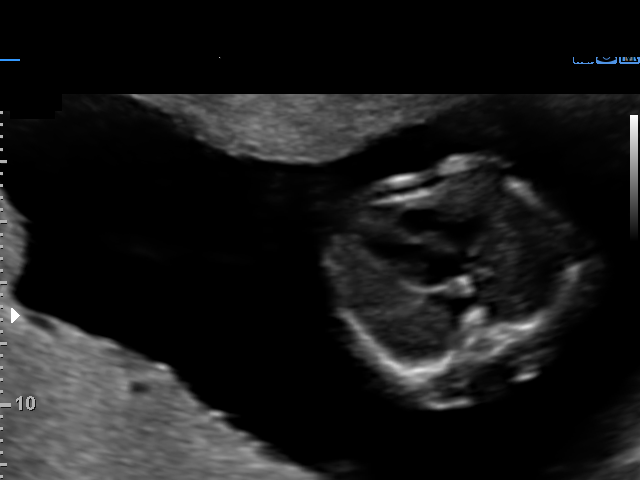
[im 41/74]
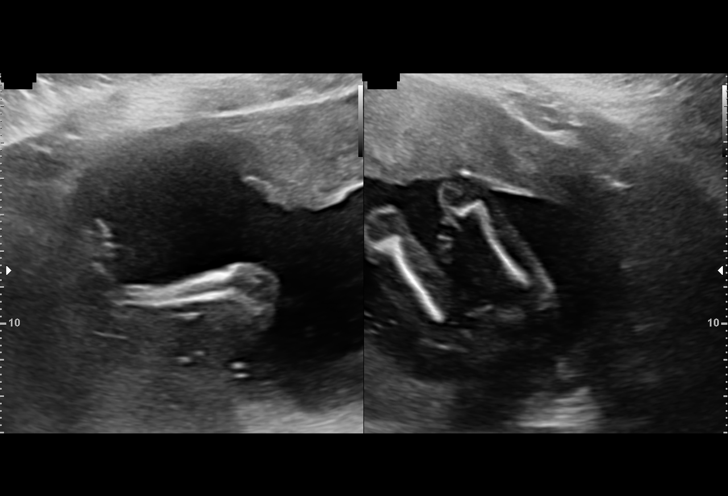
[im 46/74]
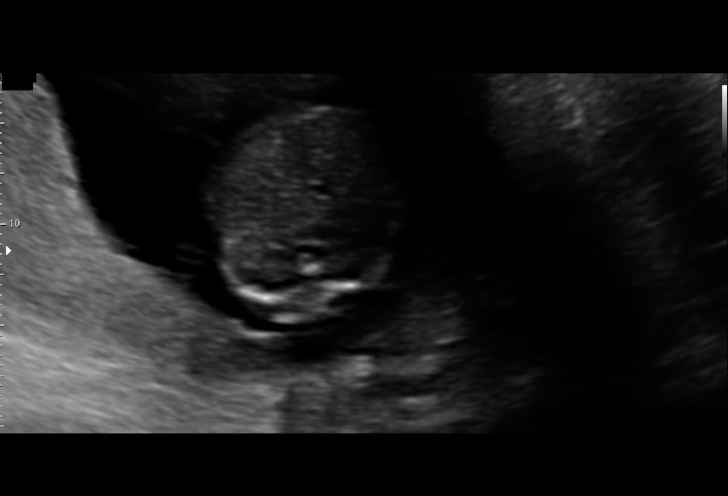
[im 52/74]
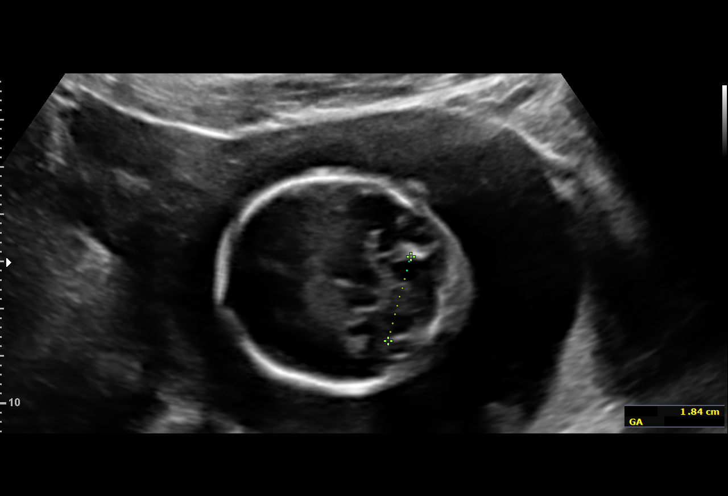
[im 57/74]
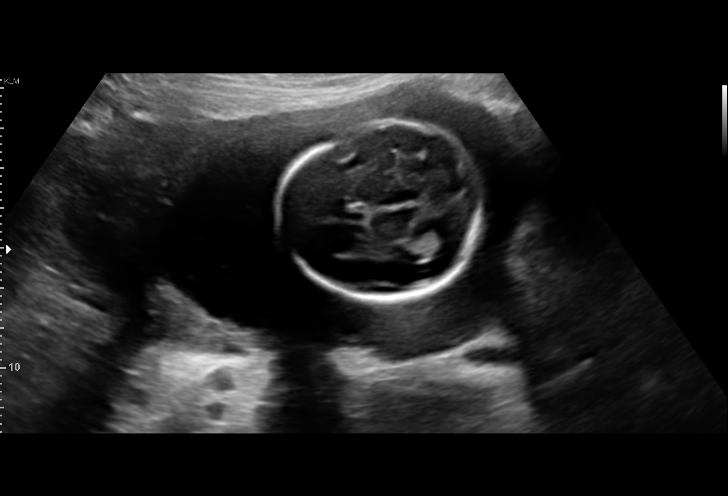
[im 63/74]
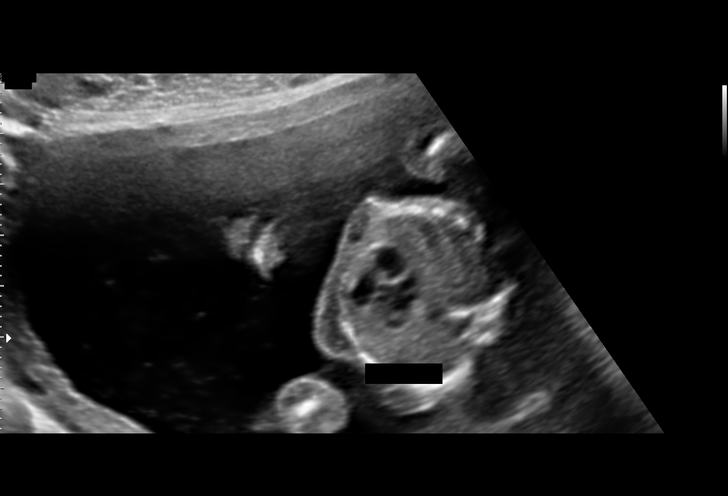
[im 68/74]
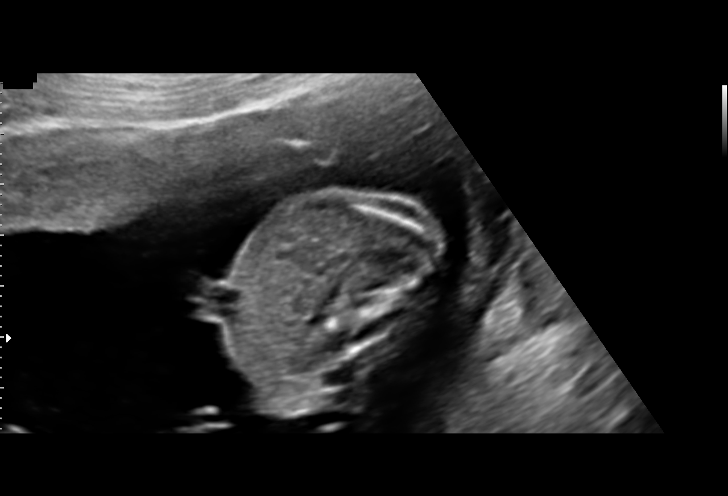
[im 74/74]
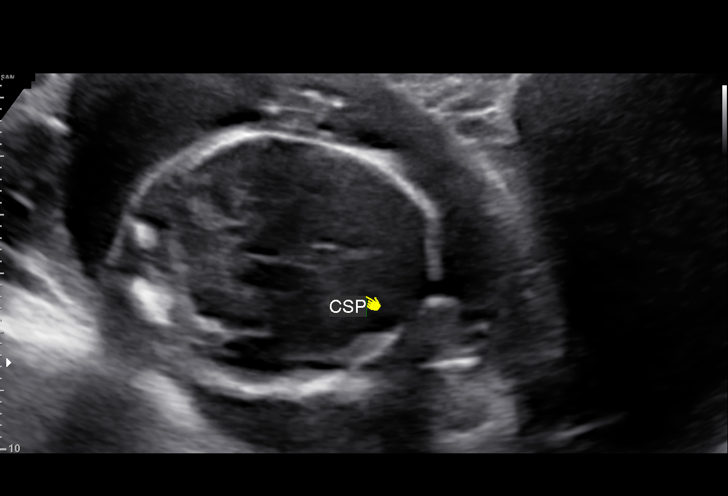

[14 of 28 positions shown; findings below may reference images not displayed]

pm)

OB/Gyn Clinic

1  ELITEBABY BAZILAH           [id number]      [id number]     [id number]
Indications

18 weeks gestation of pregnancy
Encounter for antenatal screening for
malformations
Maternal morbid obesity (BMI 45)
OB History

Blood Type:            Height:  5'5"   Weight (lb):  293       BMI:
Gravidity:    2         Term:   0        Prem:   0        SAB:   1
TOP:          0       Ectopic:  0        Living: 0
Fetal Evaluation

Num Of Fetuses:     1
Fetal Heart         129
Rate(bpm):
Cardiac Activity:   Observed
Presentation:       Variable
Placenta:           Posterior Fundal, above cervical os
P. Cord Insertion:  Visualized

Amniotic Fluid
AFI FV:      Subjectively within normal limits
Biometry
BPD:      43.3  mm     G. Age:  19w 1d         83  %    CI:        77.15   %    70 - 86
FL/HC:      18.3   %    15.8 - 18
HC:      156.1  mm     G. Age:  18w 4d         55  %    HC/AC:      1.05        1.07 -
AC:      149.1  mm     G. Age:  20w 1d         93  %    FL/BPD:     65.8   %
FL:       28.5  mm     G. Age:  18w 5d         61  %    FL/AC:      19.1   %    20 - 24
HUM:        29  mm     G. Age:  19w 3d         85  %
CER:      18.4  mm     G. Age:  18w 1d         47  %

Est. FW:     290  gm    0 lb 10 oz      65  %
Gestational Age

LMP:           18w 2d        Date:  09/13/16                 EDD:   06/20/17
U/S Today:     19w 1d                                        EDD:   06/14/17
Best:          18w 2d     Det. By:  LMP  (09/13/16)          EDD:   06/20/17
Anatomy

Cranium:               Appears normal         Aortic Arch:            Appears normal
Cavum:                 Not well visualized    Ductal Arch:            Appears normal
Ventricles:            Appears normal         Diaphragm:              Appears normal
Choroid Plexus:        Appears normal         Stomach:                Appears normal, left
sided
Cerebellum:            Appears normal         Abdomen:                Appears normal
Posterior Fossa:       Appears normal         Abdominal Wall:         Appears nml (cord
insert, abd wall)
Nuchal Fold:           Appears normal         Cord Vessels:           Appears normal (3
vessel cord)
Face:                  Appears normal         Kidneys:                Appear normal
(orbits and profile)
Lips:                  Appears normal         Bladder:                Appears normal
Thoracic:              Appears normal         Spine:                  Appears normal
Heart:                 Appears normal         Upper Extremities:      Appears normal
(4CH, axis, and situs
RVOT:                  Not well visualized    Lower Extremities:      Appears normal
LVOT:                  Not well visualized

Other:  Fetus appears to be a female. Heels and 5th digit visualized.
Technically difficult due to maternal habitus and fetal position.
Cervix Uterus Adnexa

Cervix
Length:            4.2  cm.
Normal appearance by transabdominal scan.
Impression

SIUP at 18+2 weeks
Normal detailed fetal anatomy; limited views of CSP and
outflow tracts
Markers of aneuploidy: none
Normal amniotic fluid volume
Measurements consistent with LMP dating
Recommendations

Follow-up ultrasound in 4-6 weeks to complete anatomy
survey

## 2017-06-21 MED ORDER — LIDOCAINE HCL (PF) 1 % IJ SOLN
30.0000 mL | INTRAMUSCULAR | Status: DC | PRN
  Filled 2017-06-21: qty 30

## 2017-06-21 MED ORDER — TERBUTALINE SULFATE 1 MG/ML IJ SOLN
0.2500 mg | Freq: Once | INTRAMUSCULAR | Status: DC | PRN
  Filled 2017-06-21: qty 1

## 2017-06-21 MED ORDER — LACTATED RINGERS IV SOLN: 500.0000 mL | INTRAVENOUS | Status: DC | PRN

## 2017-06-21 MED ORDER — SOD CITRATE-CITRIC ACID 500-334 MG/5ML PO SOLN: 30.0000 mL | ORAL | Status: DC | PRN

## 2017-06-21 MED ORDER — ONDANSETRON HCL 4 MG/2ML IJ SOLN: 4.0000 mg | Freq: Four times a day (QID) | INTRAMUSCULAR | Status: DC | PRN

## 2017-06-21 MED ORDER — ACETAMINOPHEN 325 MG PO TABS: 650.0000 mg | ORAL_TABLET | ORAL | Status: DC | PRN

## 2017-06-21 MED ORDER — MISOPROSTOL 25 MCG QUARTER TABLET
25.0000 ug | ORAL_TABLET | ORAL | Status: DC | PRN
  Administered 2017-06-21 (×2): 25 ug via VAGINAL
  Filled 2017-06-21 (×4): qty 1

## 2017-06-21 MED ORDER — LACTATED RINGERS IV SOLN
INTRAVENOUS | Status: DC
  Administered 2017-06-21 (×2): via INTRAVENOUS

## 2017-06-21 MED ORDER — OXYCODONE-ACETAMINOPHEN 5-325 MG PO TABS: 2.0000 | ORAL_TABLET | ORAL | Status: DC | PRN

## 2017-06-21 MED ORDER — OXYCODONE-ACETAMINOPHEN 5-325 MG PO TABS: 1.0000 | ORAL_TABLET | ORAL | Status: DC | PRN

## 2017-06-21 MED ORDER — OXYTOCIN BOLUS FROM INFUSION
500.0000 mL | Freq: Once | INTRAVENOUS | Status: AC
  Administered 2017-06-22: 500 mL via INTRAVENOUS

## 2017-06-21 MED ORDER — FENTANYL CITRATE (PF) 100 MCG/2ML IJ SOLN
100.0000 ug | INTRAMUSCULAR | Status: DC | PRN
  Administered 2017-06-21 – 2017-06-22 (×3): 100 ug via INTRAVENOUS
  Filled 2017-06-21 (×3): qty 2

## 2017-06-21 MED ORDER — FENTANYL CITRATE (PF) 100 MCG/2ML IJ SOLN: 50.0000 ug | INTRAMUSCULAR | Status: DC | PRN

## 2017-06-21 MED ORDER — OXYTOCIN 40 UNITS IN LACTATED RINGERS INFUSION - SIMPLE MED
2.5000 [IU]/h | INTRAVENOUS | Status: DC
  Filled 2017-06-21: qty 1000

## 2017-06-21 NOTE — Patient Instructions (Signed)
Labor Induction Labor induction is when steps are taken to cause a pregnant woman to begin the labor process. Most women go into labor on their own between 37 weeks and 42 weeks of the pregnancy. When this does not happen or when there is a medical need, methods may be used to induce labor. Labor induction causes a pregnant woman's uterus to contract. It also causes the cervix to soften (ripen), open (dilate), and thin out (efface). Usually, labor is not induced before 39 weeks of the pregnancy unless there is a problem with the baby or mother. Before inducing labor, your health care provider will consider a number of factors, including the following:  The medical condition of you and the baby.  How many weeks along you are.  The status of the baby's lung maturity.  The condition of the cervix.  The position of the baby. What are the reasons for labor induction? Labor may be induced for the following reasons:  The health of the baby or mother is at risk.  The pregnancy is overdue by 1 week or more.  The water breaks but labor does not start on its own.  The mother has a health condition or serious illness, such as high blood pressure, infection, placental abruption, or diabetes.  The amniotic fluid amounts are low around the baby.  The baby is distressed. Convenience or wanting the baby to be born on a certain date is not a reason for inducing labor. What methods are used for labor induction? Several methods of labor induction may be used, such as:  Prostaglandin medicine. This medicine causes the cervix to dilate and ripen. The medicine will also start contractions. It can be taken by mouth or by inserting a suppository into the vagina.  Inserting a thin tube (catheter) with a balloon on the end into the vagina to dilate the cervix. Once inserted, the balloon is expanded with water, which causes the cervix to open.  Stripping the membranes. Your health care provider separates  amniotic sac tissue from the cervix, causing the cervix to be stretched and causing the release of a hormone called progesterone. This may cause the uterus to contract. It is often done during an office visit. You will be sent home to wait for the contractions to begin. You will then come in for an induction.  Breaking the water. Your health care provider makes a hole in the amniotic sac using a small instrument. Once the amniotic sac breaks, contractions should begin. This may still take hours to see an effect.  Medicine to trigger or strengthen contractions. This medicine is given through an IV access tube inserted into a vein in your arm. All of the methods of induction, besides stripping the membranes, will be done in the hospital. Induction is done in the hospital so that you and the baby can be carefully monitored. How long does it take for labor to be induced? Some inductions can take up to 2-3 days. Depending on the cervix, it usually takes less time. It takes longer when you are induced early in the pregnancy or if this is your first pregnancy. If a mother is still pregnant and the induction has been going on for 2-3 days, either the mother will be sent home or a cesarean delivery will be needed. What are the risks associated with labor induction? Some of the risks of induction include:  Changes in fetal heart rate, such as too high, too low, or erratic.  Fetal distress.    Chance of infection for the mother and baby.  Increased chance of having a cesarean delivery.  Breaking off (abruption) of the placenta from the uterus (rare).  Uterine rupture (very rare). When induction is needed for medical reasons, the benefits of induction may outweigh the risks. What are some reasons for not inducing labor? Labor induction should not be done if:  It is shown that your baby does not tolerate labor.  You have had previous surgeries on your uterus, such as a myomectomy or the removal of  fibroids.  Your placenta lies very low in the uterus and blocks the opening of the cervix (placenta previa).  Your baby is not in a head-down position.  The umbilical cord drops down into the birth canal in front of the baby. This could cut off the baby's blood and oxygen supply.  You have had a previous cesarean delivery.  There are unusual circumstances, such as the baby being extremely premature. This information is not intended to replace advice given to you by your health care provider. Make sure you discuss any questions you have with your health care provider. Document Released: 02/23/2007 Document Revised: 03/11/2016 Document Reviewed: 05/03/2013 Elsevier Interactive Patient Education  2017 Elsevier Inc.  

## 2017-06-21 NOTE — Progress Notes (Signed)
   PRENATAL VISIT NOTE  Subjective:  Kaitlin Jackson is a 26 y.o. G2P0010 at 808w1d being seen today for ongoing prenatal care.  She is currently monitored for the following issues for this low-risk pregnancy and has Supervision of normal pregnancy; Obesity in pregnancy, antepartum; Rubella non-immune status, antepartum; Marijuana use; Uterine size date discrepancy pregnancy, third trimester; Anemia in pregnancy; and Group B streptococcal carriage complicating pregnancy on her problem list.  Patient reports  Denies HA, vision changes or epigastric pain.  Contractions: Irregular. Vag. Bleeding: None.  Movement: Present. Denies leaking of fluid.   The following portions of the patient's history were reviewed and updated as appropriate: allergies, current medications, past family history, past medical history, past social history, past surgical history and problem list. Problem list updated.  Objective:   Vitals:   06/21/17 0854 06/21/17 1010  BP: (!) 148/75 139/82  Pulse: 75     Fetal Status: Fetal Heart Rate (bpm): NST Fundal Height: 42 cm Movement: Present  Presentation: Vertex  General:  Alert, oriented and cooperative. Patient is in no acute distress.  Skin: Skin is warm and dry. No rash noted.   Cardiovascular: Normal heart rate noted  Respiratory: Normal respiratory effort, no problems with respiration noted  Abdomen: Soft, gravid, appropriate for gestational age.  Pain/Pressure: Present     Pelvic: Cervical exam performed Dilation: 1 Effacement (%): 0 Station: -3  Extremities: Normal range of motion.  Edema: Mild pitting, slight indentation  Mental Status:  Normal mood and affect. Normal behavior. Normal judgment and thought content.   Assessment and Plan:  Pregnancy: G2P0010 at 528w1d  GHNT vs Pre-E Direct admit for IOL for GHTN. Report given to Dr. Adrian BlackwaterStinson.  Return in about 4 weeks (around 07/19/2017) for Postpartum visit.   Kaitlin Jackson, CNM

## 2017-06-21 NOTE — Progress Notes (Signed)
Labor Progress Note  S: Patient seen & examined for progress of labor. Patient comfortable, not in active labor.   O: BP (!) 141/60   Pulse 90   Temp 98.5 F (36.9 C) (Oral)   Resp 18   Ht 5\' 7"  (1.702 m)   Wt (!) 149.7 kg (330 lb)   LMP 09/13/2016 (Exact Date)   BMI 51.69 kg/m   FHT: 145bpm, mod var, +accels, no decels TOCO: q2-683min, patient looks comfortable during contractions  CVE: Dilation: 1 Effacement (%): Thick Station: -3 Presentation: Vertex Exam by:: dr key  A&P: 26 y.o. G2P0010 2757w1d here for IOL for gHTN.  GBS +, waiting to give penicillin until she is more actively in labor  Hypertensive at times today with systolic BP in 140s and diastolic in 100s, however no severe range pressures.  Last BP was 141/60.  Will initiate labetalol protocol if pressures become severe range.  S/p vaginal cytotec x 1; gave a second vaginal cytotec @ 1600, tried to get a foley bulb in, but were unsuccessful.  Will try to place foley bulb again at a later time  Continue induction Anticipate SVD  Lezlie OctaveAmanda Roger Kettles, MD [facility] Resident PGY-1 06/21/2017 3:11 PM

## 2017-06-21 NOTE — Anesthesia Pain Management Evaluation Note (Signed)
  CRNA Pain Management Visit Note  Patient: Kaitlin Jackson, 26 y.o., female  "Hello [facility] am a [facility]. We have an anesthesia team available at all times to provide care throughout the hospital, including epidural management and anesthesia for C-section. I don't know your plan for the delivery whether it a natural birth, water birth, IV sedation, nitrous supplementation, doula or epidural, but we want to meet your pain goals."   1.Was your pain managed to your expectations on prior hospitalizations?   No prior hospitalizations  2.What is your expectation for pain management during this hospitalization?     Nitrous Oxide  3.How can we help you reach that goal? unsure  Record the patient's initial score and the patient's pain goal.   Pain: 5  Pain Goal: 10 [facility] wants you to be able to say your pain was always managed very well.  Cephus ShellingBURGER,Kesi Perrow 06/21/2017

## 2017-06-21 NOTE — H&P (Signed)
OBSTETRIC ADMISSION HISTORY AND PHYSICAL  Kaitlin Jackson is a 26 y.o. female G2P0010 with IUP at 2271w1d by first trimester U/S presenting for IOL secondary to gestational hypertension. She reports +FMs, No LOF, no VB, no blurry vision, headaches or peripheral edema. She does endorse RUQ pain that has been intermittent over the last two weeks.  She plans on breast feeding. She declines birth control postpartum as she states she will not be sexually active.  [facility]  Dating: By 7 week U/S --->  Estimated Date of Delivery: 06/20/17  Sono:    @[redacted]w[redacted]d , CWD, normal anatomy, cephalic presentation, 1787g, 19%66% EFW   Prenatal History/Complications:   [facility]  Dating LMP c/w 7 week scan Blood type: A/POS/-- (02/12 1016)   Genetic Screen 1 Screen:    AFP:  neg Antibody:NEG (02/12 1016)  Anatomic US Limited, rescan at 24 weeks> nml female, posterior placenta, anatomy complete Rubella: <0.90 (02/12 1016)nonimmune  GTT Early:   nml            Third trimester: normal RPR: Non Reactive (06/13 0847)   Flu vaccine  Given [facility] Employee HBsAg: NEGATIVE (02/12 1016)   TDaP vaccine   03/30/2017                                          HIV: NONREACTIVE (02/12 1016)  Baby Food  Breast                                             JYN:WGNFAOZHGBS:Positive (08/09 1033)  Contraception  none Pap: Normal in 2017  Circumcision  Girl   Pediatrician  undecided   Support Person  Kaitlin Jackson [phone]     Past Medical History: Past Medical History:  Diagnosis Date  . Abnormal Pap smear   . Chlamydia   . Kidney stones   . Obesity   . Renal disorder   . Vaginal Pap smear, abnormal     Past Surgical History: Past Surgical History:  Procedure Laterality Date  . NO PAST SURGERIES      Obstetrical History: OB History    Gravida Para Term Preterm AB Living   2 0     1     SAB TAB Ectopic Multiple Live Births   1              Social History: Social History   Social  History  . Marital status: Single    Spouse name: N/A  . Number of children: N/A  . Years of education: N/A   Social History Main Topics  . Smoking status: Former Smoker    Packs/day: 0.50    Types: Cigarettes    Quit date: 11/14/2016  . Smokeless tobacco: Never Used  . Alcohol use No  . Drug use: No  . Sexual activity: Yes    Birth control/ protection: None   Other Topics Concern  . None   Social History Narrative  . None    Family History: Family History  Problem Relation Age of Onset  . Miscarriages / [place] Mother   . Hypertension Mother   . Asthma Brother   . Diabetes Paternal Grandmother     Allergies: No Known Allergies  Prescriptions Prior to  Admission  Medication Sig Dispense Refill Last Dose  . Prenatal Vit-Fe Fumarate-FA (PREPLUS) 27-1 MG TABS Take 1 tablet by mouth daily. 30 tablet 13 Taking     Review of Systems   All systems reviewed and negative except as stated in HPI  Blood pressure (!) 143/101, pulse 93, temperature 98.5 F (36.9 C), temperature source Oral, resp. rate 20, height 5\' 7"  (1.702 m), weight (!) 149.7 kg (330 lb), last menstrual period 09/13/2016. General appearance: alert and cooperative Lungs: clear to auscultation bilaterally Heart: regular rate and rhythm Abdomen: soft, non-tender; bowel sounds normal Pelvic: Deferred Extremities: Homans sign is negative, no sign of DVT Presentation: cephalic Fetal monitoringBaseline: 140 bpm, Variability: Good {> 6 bpm), Accelerations: Reactive and Decelerations: Absent Uterine activity: irregular     Prenatal labs: ABO, Rh: A/POS/-- (02/12 1016) Antibody: NEG (02/12 1016) Rubella: <0.90 (02/12 1016) RPR: Non Reactive (06/13 0847)  HBsAg: NEGATIVE (02/12 1016)  HIV: NONREACTIVE (02/12 1016)  GBS: Positive (08/09 1033)  1 hr Glucola Pass (03/30/17) Genetic screening  AFP negative (02/03/17) Anatomy US: WNL (01/19/17, 5/918)  Prenatal Transfer Tool  Maternal Diabetes:  No Genetic Screening: Normal Maternal Ultrasounds/Referrals: Normal Fetal Ultrasounds or other Referrals:  None Maternal Substance Abuse:  No Significant Maternal Medications:  None Significant Maternal Lab Results: None  Results for orders placed or performed in visit on 06/21/17 (from the past 24 hour(s))  POCT urinalysis dip (device)   Collection Time: 06/21/17 10:16 AM  Result Value Ref Range   Glucose, UA NEGATIVE NEGATIVE mg/dL   Bilirubin Urine SMALL (A) NEGATIVE   Ketones, ur NEGATIVE NEGATIVE mg/dL   Specific Gravity, Urine >=1.030 1.005 - 1.030   Hgb urine dipstick NEGATIVE NEGATIVE   pH 6.5 5.0 - 8.0   Protein, ur NEGATIVE NEGATIVE mg/dL   Urobilinogen, UA 0.2 0.0 - 1.0 mg/dL   Nitrite NEGATIVE NEGATIVE   Leukocytes, UA TRACE (A) NEGATIVE    Patient Active Problem List   Diagnosis Date Noted  . Gestational hypertension 06/21/2017  . Group B streptococcal carriage complicating pregnancy 06/06/2017  . Anemia in pregnancy 05/12/2017  . Uterine size date discrepancy pregnancy, third trimester 04/13/2017  . Marijuana use 12/07/2016  . Rubella non-immune status, antepartum 12/01/2016  . Supervision of normal pregnancy 11/29/2016  . Obesity in pregnancy, antepartum 11/29/2016    Assessment/Plan:  Kaitlin Jackson is a 26 y.o. G2P0010 at [redacted]w[redacted]d here for IOL secondary to gestational hypertension.  She is GBS positive.   #gHTN: Monitor BP closely - overall reassuring currently, U/A without protein, CMP pending #Labor: IOL with cytotec, will re-evaluate in 4 hours and place FB #Pain: Fentanyl PRN, epidural if desired #FWB: Category 1 #ID:  GBS positive - will start PCN as labor progresses #MOF: Breast #MOC: Declines - states she will not be sexually active #Circ:  N/A  Expect NSVD of baby girl, "Kaitlin Jackson".   Kaitlin Lesch Tiny Rietz, DO PGY-2 06/21/2017, 11:05 AM

## 2017-06-21 NOTE — Progress Notes (Signed)
Pt stated she wakes up with headaches lately

## 2017-06-21 NOTE — Progress Notes (Signed)

## 2017-06-21 NOTE — Progress Notes (Signed)
Patient ID: Kaitlin Jackson, female   DOB: 1990/11/24, 26 y.o.   MRN: [id number] Doing well  Vitals:   06/21/17 1338 06/21/17 1550 06/21/17 1819 06/21/17 1925  BP: (!) 141/60  111/73 107/62  Pulse: 90  84 90  Resp: 18  18   Temp:  99.4 F (37.4 C) 97.8 F (36.6 C) 98.4 F (36.9 C)  TempSrc:  Axillary Oral Oral  Weight:      Height:       FHR stable and reassuring  Dilation: 1 Effacement (%): 50 Cervical Position: Posterior Station: -2 Presentation: Vertex Exam by:: Wynelle BourgeoisMarie Johneisha Jackson CNM  Foley bulb inserted and inflated  Will wait for progress and dilation

## 2017-06-22 ENCOUNTER — Encounter (HOSPITAL_COMMUNITY): Payer: Self-pay | Admitting: Anesthesiology

## 2017-06-22 ENCOUNTER — Inpatient Hospital Stay (HOSPITAL_COMMUNITY): Payer: 59 | Admitting: Anesthesiology

## 2017-06-22 DIAGNOSIS — O134 Gestational [pregnancy-induced] hypertension without significant proteinuria, complicating childbirth: Secondary | ICD-10-CM

## 2017-06-22 DIAGNOSIS — O99824 Streptococcus B carrier state complicating childbirth: Secondary | ICD-10-CM

## 2017-06-22 DIAGNOSIS — Z3A40 40 weeks gestation of pregnancy: Secondary | ICD-10-CM

## 2017-06-22 LAB — CBC
HCT: 32.3 % — ABNORMAL LOW (ref 36.0–46.0)
Hemoglobin: 10.8 g/dL — ABNORMAL LOW (ref 12.0–15.0)
MCH: 27.8 pg (ref 26.0–34.0)
MCHC: 33.4 g/dL (ref 30.0–36.0)
MCV: 83.2 fL (ref 78.0–100.0)
PLATELETS: 229 10*3/uL (ref 150–400)
RBC: 3.88 MIL/uL (ref 3.87–5.11)
RDW: 14.8 % (ref 11.5–15.5)
WBC: 15.3 10*3/uL — ABNORMAL HIGH (ref 4.0–10.5)

## 2017-06-22 LAB — RPR: RPR: NONREACTIVE

## 2017-06-22 MED ORDER — PENICILLIN G POTASSIUM 5000000 UNITS IJ SOLR
5.0000 10*6.[IU] | Freq: Once | INTRAVENOUS | Status: AC
  Administered 2017-06-22: 5 10*6.[IU] via INTRAVENOUS
  Filled 2017-06-22: qty 5

## 2017-06-22 MED ORDER — SENNOSIDES-DOCUSATE SODIUM 8.6-50 MG PO TABS
2.0000 | ORAL_TABLET | ORAL | Status: DC
  Administered 2017-06-23: 2 via ORAL
  Filled 2017-06-22 (×2): qty 2

## 2017-06-22 MED ORDER — ACETAMINOPHEN 325 MG PO TABS: 650.0000 mg | ORAL_TABLET | ORAL | Status: DC | PRN

## 2017-06-22 MED ORDER — OXYTOCIN 40 UNITS IN LACTATED RINGERS INFUSION - SIMPLE MED
1.0000 m[IU]/min | INTRAVENOUS | Status: DC
  Administered 2017-06-22: 2 m[IU]/min via INTRAVENOUS

## 2017-06-22 MED ORDER — PRENATAL MULTIVITAMIN CH
1.0000 | ORAL_TABLET | Freq: Every day | ORAL | Status: DC
  Administered 2017-06-23 – 2017-06-24 (×2): 1 via ORAL
  Filled 2017-06-22 (×2): qty 1

## 2017-06-22 MED ORDER — PHENYLEPHRINE 40 MCG/ML (10ML) SYRINGE FOR IV PUSH (FOR BLOOD PRESSURE SUPPORT)
PREFILLED_SYRINGE | INTRAVENOUS | Status: AC
  Filled 2017-06-22: qty 10

## 2017-06-22 MED ORDER — DIPHENHYDRAMINE HCL 50 MG/ML IJ SOLN: 12.5000 mg | INTRAMUSCULAR | Status: DC | PRN

## 2017-06-22 MED ORDER — LACTATED RINGERS IV SOLN: 500.0000 mL | Freq: Once | INTRAVENOUS | Status: DC

## 2017-06-22 MED ORDER — IBUPROFEN 600 MG PO TABS
600.0000 mg | ORAL_TABLET | Freq: Four times a day (QID) | ORAL | Status: DC
  Administered 2017-06-22 – 2017-06-24 (×7): 600 mg via ORAL
  Filled 2017-06-22 (×9): qty 1

## 2017-06-22 MED ORDER — PHENYLEPHRINE 40 MCG/ML (10ML) SYRINGE FOR IV PUSH (FOR BLOOD PRESSURE SUPPORT)
80.0000 ug | PREFILLED_SYRINGE | INTRAVENOUS | Status: DC | PRN
  Filled 2017-06-22: qty 5

## 2017-06-22 MED ORDER — PENICILLIN G POT IN DEXTROSE 60000 UNIT/ML IV SOLN
3.0000 10*6.[IU] | INTRAVENOUS | Status: DC
  Administered 2017-06-22: 3 10*6.[IU] via INTRAVENOUS
  Filled 2017-06-22 (×6): qty 50

## 2017-06-22 MED ORDER — ZOLPIDEM TARTRATE 5 MG PO TABS: 5.0000 mg | ORAL_TABLET | Freq: Every evening | ORAL | Status: DC | PRN

## 2017-06-22 MED ORDER — ONDANSETRON HCL 4 MG/2ML IJ SOLN: 4.0000 mg | INTRAMUSCULAR | Status: DC | PRN

## 2017-06-22 MED ORDER — EPHEDRINE 5 MG/ML INJ
10.0000 mg | INTRAVENOUS | Status: DC | PRN
  Filled 2017-06-22: qty 2

## 2017-06-22 MED ORDER — SIMETHICONE 80 MG PO CHEW: 80.0000 mg | CHEWABLE_TABLET | ORAL | Status: DC | PRN

## 2017-06-22 MED ORDER — DIBUCAINE 1 % RE OINT: 1.0000 "application " | TOPICAL_OINTMENT | RECTAL | Status: DC | PRN

## 2017-06-22 MED ORDER — TETANUS-DIPHTH-ACELL PERTUSSIS 5-2.5-18.5 LF-MCG/0.5 IM SUSP: 0.5000 mL | Freq: Once | INTRAMUSCULAR | Status: DC

## 2017-06-22 MED ORDER — COCONUT OIL OIL
1.0000 "application " | TOPICAL_OIL | Status: DC | PRN
  Administered 2017-06-23: 1 via TOPICAL
  Filled 2017-06-22: qty 120

## 2017-06-22 MED ORDER — ONDANSETRON HCL 4 MG PO TABS: 4.0000 mg | ORAL_TABLET | ORAL | Status: DC | PRN

## 2017-06-22 MED ORDER — BENZOCAINE-MENTHOL 20-0.5 % EX AERO
1.0000 "application " | INHALATION_SPRAY | CUTANEOUS | Status: DC | PRN
  Administered 2017-06-22: 1 via TOPICAL
  Filled 2017-06-22: qty 56

## 2017-06-22 MED ORDER — FENTANYL 2.5 MCG/ML BUPIVACAINE 1/10 % EPIDURAL INFUSION (WH - ANES)
14.0000 mL/h | INTRAMUSCULAR | Status: DC | PRN
Start: 2017-06-22 — End: 2017-06-22
  Administered 2017-06-22: 14 mL/h via EPIDURAL

## 2017-06-22 MED ORDER — LIDOCAINE HCL (PF) 1 % IJ SOLN
INTRAMUSCULAR | Status: DC | PRN
  Administered 2017-06-22: 8 mL via EPIDURAL
  Administered 2017-06-22: 6 mL via EPIDURAL

## 2017-06-22 MED ORDER — WITCH HAZEL-GLYCERIN EX PADS: 1.0000 "application " | MEDICATED_PAD | CUTANEOUS | Status: DC | PRN

## 2017-06-22 MED ORDER — TERBUTALINE SULFATE 1 MG/ML IJ SOLN
0.2500 mg | Freq: Once | INTRAMUSCULAR | Status: DC | PRN
  Filled 2017-06-22: qty 1

## 2017-06-22 MED ORDER — FENTANYL 2.5 MCG/ML BUPIVACAINE 1/10 % EPIDURAL INFUSION (WH - ANES)
INTRAMUSCULAR | Status: AC
  Filled 2017-06-22: qty 100

## 2017-06-22 MED ORDER — DIPHENHYDRAMINE HCL 25 MG PO CAPS: 25.0000 mg | ORAL_CAPSULE | Freq: Four times a day (QID) | ORAL | Status: DC | PRN

## 2017-06-22 NOTE — Anesthesia Postprocedure Evaluation (Signed)
Anesthesia Post Note  Patient: Kaitlin Jackson  Procedure(s) Performed: * No procedures listed *     Patient location during evaluation: Mother Baby Anesthesia Type: Epidural Level of consciousness: awake and alert and oriented Pain management: pain level controlled Vital Signs Assessment: post-procedure vital signs reviewed and stable Respiratory status: spontaneous breathing and nonlabored ventilation Cardiovascular status: stable Postop Assessment: no headache, no signs of nausea or vomiting, no backache, adequate PO intake, epidural receding and patient able to bend at knees Anesthetic complications: no    Last Vitals:  Vitals:   06/22/17 1250 06/22/17 1400  BP: (!) 128/54 131/66  Pulse: (!) 102 95  Resp: 20 20  Temp: 36.8 C 37.3 C  SpO2:      Last Pain:  Vitals:   06/22/17 1400  TempSrc: Oral  PainSc:    Pain Goal: Patients Stated Pain Goal: 9 (06/21/17 1109)               Safi Culotta Hristova

## 2017-06-22 NOTE — Progress Notes (Signed)
Patient ID: Kaitlin OmanKenyatta L Stimmel, female   DOB: 02-13-1991, 26 y.o.   MRN: [id number] Vitals:   06/22/17 0621 06/22/17 0630 06/22/17 0644 06/22/17 0700  BP: 133/75 133/78 135/76 (!) 144/84  Pulse: 84 82 89 90  Resp: 18 16 18 16   Temp: 98.3 F (36.8 C)     TempSrc: Oral     SpO2:      Weight:      Height:       FHR stable  UCs every 2 min  Not coping well with UCs, wants epidural Dilation: 5.5 Effacement (%): 90 Cervical Position: Posterior Station: -1 Presentation: Vertex Exam by:: Southern CompanySavannah Brendle RN  Will get epidural

## 2017-06-22 NOTE — Progress Notes (Signed)
Kaitlin L Abigail Miyamotohacker is a 26 y.o. G2P0010 at 5161w2d admitted for induction of labor due to gestational Hypertension.  Subjective: Doing well.  Having rectal pressure.  Otherwise, comfortable with epidural in place.  Family at bedside.   Objective: BP (!) 144/84   Pulse 90   Temp 98.3 F (36.8 C) (Oral)   Resp 16   Ht 5\' 7"  (1.702 m)   Wt (!) 149.7 kg (330 lb)   LMP 09/13/2016 (Exact Date)   SpO2 98%   BMI 51.69 kg/m  No intake/output data recorded. No intake/output data recorded.  FHT: baseline 140, moderate variability, +accels, early decels UC:   regular, every 2 minutes SVE:   Dilation: 7 Effacement (%): 100 Station: -1 Exam by:: m wilkins rnc  Labs: Lab Results  Component Value Date   WBC 15.3 (H) 06/22/2017   HGB 10.8 (L) 06/22/2017   HCT 32.3 (L) 06/22/2017   MCV 83.2 06/22/2017   PLT 229 06/22/2017    Assessment / Plan: Induction of labor due to gestational hypertension,  progressing well on pitocin  Labor: Progressing well on pitocin, continue to monitor Preeclampsia:  no signs or symptoms of toxicity Fetal Wellbeing:  Category I Pain Control:  Epidural I/D:  GBS positive on PCN Anticipated MOD:  NSVD  Jearld LeschMary K Elihu Jackson 06/22/2017, 10:20 AM

## 2017-06-22 NOTE — Lactation Note (Signed)
This note was copied from a baby's chart. Lactation Consultation Note  Patient Name: Kaitlin Jackson Today's Date: 06/22/2017 Reason for consult: Initial assessment;Term;Primapara;1st time breastfeeding  Visited with this first time Mom [facility] employee). Baby 3 hrs old.  Mom states baby latched in L&D, and again for 30 mins.  Mom describes a wide, deep latch to breast.  Reviewed hand expression, and benefits to spoon feeding baby some colostrum.  Baby in GMOB's arms, and Mom waiting on her meal.  Recommended placing baby STS, and watching baby for cues.  Basic teaching done.  Encouraged frequent feedings >8 feedings per 24 hrs. Mom given Lactation Brochure.  Encouraged Mom to call prn for assistance, and LC to follow up in am.    Consult Status Consult Status: Follow-up Date: 06/23/17 Follow-up type: In-patient    Kaitlin Jackson, Kaitlin Jackson 06/22/2017, 2:20 PM

## 2017-06-22 NOTE — Anesthesia Procedure Notes (Signed)
Epidural Patient location during procedure: OB Start time: 06/22/2017 5:35 AM End time: 06/22/2017 5:39 AM  Staffing Anesthesiologist: Leilani AbleHATCHETT, Griffyn Kucinski Performed: anesthesiologist   Preanesthetic Checklist Completed: patient identified, surgical consent, pre-op evaluation, timeout performed, IV checked, risks and benefits discussed and monitors and equipment checked  Epidural Patient position: sitting Prep: site prepped and draped and DuraPrep Patient monitoring: continuous pulse ox and blood pressure Approach: midline Location: L3-L4 Injection technique: LOR air  Needle:  Needle type: Tuohy  Needle gauge: 17 G Needle length: 9 cm and 9 Needle insertion depth: 9 cm Catheter type: closed end flexible Catheter size: 19 Gauge Catheter at skin depth: 15 cm Test dose: negative and Other  Assessment Sensory level: T9 Events: blood not aspirated, injection not painful, no injection resistance, negative IV test and no paresthesia

## 2017-06-22 NOTE — Anesthesia Preprocedure Evaluation (Signed)
Anesthesia Evaluation  Patient identified by MRN, date of birth, ID band Patient awake    Reviewed: Allergy & Precautions, H&P , NPO status , Patient's Chart, lab work & pertinent test results  Airway Mallampati: III  TM Distance: >3 FB Neck ROM: full    Dental no notable dental hx. (+) Teeth Intact   Pulmonary former smoker,    Pulmonary exam normal breath sounds clear to auscultation       Cardiovascular hypertension, negative cardio ROS Normal cardiovascular exam Rhythm:regular Rate:Normal     Neuro/Psych negative neurological ROS  negative psych ROS   GI/Hepatic negative GI ROS, Neg liver ROS,   Endo/Other  Morbid obesity  Renal/GU      Musculoskeletal   Abdominal (+) + obese,   Peds  Hematology   Anesthesia Other Findings   Reproductive/Obstetrics (+) Pregnancy                             Anesthesia Physical Anesthesia Plan  ASA: III  Anesthesia Plan: Epidural   Post-op Pain Management:    Induction:   PONV Risk Score and Plan:   Airway Management Planned:   Additional Equipment:   Intra-op Plan:   Post-operative Plan:   Informed Consent: I have reviewed the patients History and Physical, chart, labs and discussed the procedure including the risks, benefits and alternatives for the proposed anesthesia with the patient or authorized representative who has indicated his/her understanding and acceptance.     Plan Discussed with:   Anesthesia Plan Comments:         Anesthesia Quick Evaluation

## 2017-06-22 NOTE — Progress Notes (Signed)
Patient ID: Kaitlin Jackson, female   DOB: 07-17-1991, 26 y.o.   MRN: [id number] AVSS  FHR stable  Foley out  Cervix 4-5 cm

## 2017-06-23 MED ORDER — MEASLES, MUMPS & RUBELLA VAC ~~LOC~~ INJ
0.5000 mL | INJECTION | Freq: Once | SUBCUTANEOUS | Status: AC
  Administered 2017-06-24: 0.5 mL via SUBCUTANEOUS
  Filled 2017-06-23: qty 0.5

## 2017-06-23 NOTE — Progress Notes (Signed)
Post Partum Day 1 Subjective: no complaints. +BM and flatus. Pt. Has been ambulating. No nausea or vomiting. No other complaints at this time.  Objective: Blood pressure (!) 126/56, pulse 80, temperature 97.8 F (36.6 C), temperature source Oral, resp. rate 18, height 5\' 7"  (1.702 m), weight (!) 149.7 kg (330 lb), last menstrual period 09/13/2016, SpO2 98 %, unknown if currently breastfeeding.  Physical Exam:  General: alert and cooperative Lochia: appropriate Uterine Fundus: firm DVT Evaluation: No evidence of DVT seen on physical exam. No significant calf/ankle edema.   Recent Labs  06/21/17 1308 06/22/17 0505  HGB 10.9* 10.8*  HCT 32.6* 32.3*    Assessment/Plan: Plan for discharge tomorrow   LOS: 2 days   Anna Genrelay Lareina Espino 06/23/2017, 7:25 AM

## 2017-06-23 NOTE — Discharge Summary (Signed)
OB Discharge Summary     Patient Name: Kaitlin Jackson DOB: 30-Jan-1991 MRN: [id number]  Date of admission: 06/21/2017 Delivering MD: Pincus LargePHELPS, JAZMA Y   Date of discharge: 06/24/2017  Admitting diagnosis: INDUCTION Intrauterine pregnancy: 2670w2d     Secondary diagnosis:  Principal Problem:   Gestational hypertension Active Problems:   Supervision of normal pregnancy   Obesity in pregnancy, antepartum  Additional problems: none     Discharge diagnosis: Term Pregnancy Delivered and Gestational Hypertension                                                                                                Post partum procedures:none  Augmentation: Pitocin and Cytotec  Complications: None  Hospital course:  Induction of Labor With Vaginal Delivery   26 y.o. yo G2P1011 at 1570w2d was admitted to the hospital 06/21/2017 for induction of labor.  Indication for induction: Gestational hypertension.  Patient had an uncomplicated labor course as follows: Membrane Rupture Time/Date:   ,    Intrapartum Procedures: Episiotomy: None [1]                                         Lacerations:  1st degree [2]  Patient had delivery of a Viable infant.  Information for the patient's newborn:  Lucinda Dellhacker, Girl Dava [[id number]][[id number]]      06/22/2017  Details of delivery can be found in separate delivery note.  Patient had a routine postpartum course. Patient is discharged home 06/24/17.  Physical exam  Vitals:   06/23/17 0033 06/23/17 0605 06/23/17 1815 06/24/17 0619  BP: 115/60 (!) 126/56 121/61 119/73  Pulse: 87 80 84 78  Resp: 18 18 18 18   Temp: (!) 97.4 F (36.3 C) 97.8 F (36.6 C) 98.2 F (36.8 C) 97.9 F (36.6 C)  TempSrc: Oral Oral Axillary Oral  SpO2:      Weight:      Height:       General: alert, cooperative and no distress Lochia: appropriate Uterine Fundus: firm Incision: N/A DVT Evaluation: No evidence of DVT seen on physical exam. Negative Homan's sign. No cords or calf  tenderness. Labs: Lab Results  Component Value Date   WBC 15.3 (H) 06/22/2017   HGB 10.8 (L) 06/22/2017   HCT 32.3 (L) 06/22/2017   MCV 83.2 06/22/2017   PLT 229 06/22/2017   CMP Latest Ref Rng & Units 06/21/2017  Glucose 65 - 99 mg/dL 93  BUN 6 - 20 mg/dL 7  Creatinine 0.860.44 - 5.781.00 mg/dL 4.690.54  Sodium 629135 - 528145 mmol/L 135  Potassium 3.5 - 5.1 mmol/L 3.9  Chloride 101 - 111 mmol/L 106  CO2 22 - 32 mmol/L 20(L)  Calcium 8.9 - 10.3 mg/dL 9.1  Total Protein 6.5 - 8.1 g/dL 6.9  Total Bilirubin 0.3 - 1.2 mg/dL 0.4  Alkaline Phos 38 - 126 U/L 160(H)  AST 15 - 41 U/L 20  ALT 14 - 54 U/L 14    Discharge instruction: per After Visit Summary and "Baby  and Me Booklet".  After visit meds:  Allergies as of 06/24/2017   No Known Allergies     Medication List    TAKE these medications   ibuprofen 600 MG tablet Commonly known as:  ADVIL,MOTRIN Take 1 tablet (600 mg total) by mouth every 6 (six) hours.   PREPLUS 27-1 MG Tabs Take 1 tablet by mouth daily.            Discharge Care Instructions        Start     Ordered   06/24/17 0000  ibuprofen (ADVIL,MOTRIN) 600 MG tablet  Every 6 hours    Question:  Supervising Provider  Answer:  Catalina Antigua   06/24/17 0718   06/24/17 0000  Discharge patient    Question Answer Comment  Discharge disposition 01-Home or Self Care   Discharge patient date 06/24/2017      06/24/17 1610      Diet: routine diet  Activity: Advance as tolerated. Pelvic rest for 6 weeks.   Outpatient follow up: Follow up Appt: baby love nurse to come next week, check BP Future Appointments Date Time Provider Department Center  08/04/2017 10:20 AM Marylene Land, CNM [facility]   Follow up Visit:No Follow-up on file.  Postpartum contraception: None  Newborn Data: Live born female  Birth Weight: 7 lb 9.2 oz (3435 g) APGAR: 8, 9  Baby Feeding: Breast Disposition:home with mother   06/24/2017 Greig Right, CNM

## 2017-06-23 NOTE — Discharge Instructions (Signed)
Postpartum Care After Vaginal Delivery °The period of time right after you deliver your newborn is called the postpartum period. °What kind of medical care will I receive? °· You may continue to receive fluids and medicines through an IV tube inserted into one of your veins. °· If an incision was made near your vagina (episiotomy) or if you had some vaginal tearing during delivery, cold compresses may be placed on your episiotomy or your tear. This helps to reduce pain and swelling. °· You may be given a squirt bottle to use when you go to the bathroom. You may use this until you are comfortable wiping as usual. To use the squirt bottle, follow these steps: °? Before you urinate, fill the squirt bottle with warm water. Do not use hot water. °? After you urinate, while you are sitting on the toilet, use the squirt bottle to rinse the area around your urethra and vaginal opening. This rinses away any urine and blood. °? You may do this instead of wiping. As you start healing, you may use the squirt bottle before wiping yourself. Make sure to wipe gently. °? Fill the squirt bottle with clean water every time you use the bathroom. °· You will be given sanitary pads to wear. °How can I expect to feel? °· You may not feel the need to urinate for several hours after delivery. °· You will have some soreness and pain in your abdomen and vagina. °· If you are breastfeeding, you may have uterine contractions every time you breastfeed for up to several weeks postpartum. Uterine contractions help your uterus return to its normal size. °· It is normal to have vaginal bleeding (lochia) after delivery. The amount and appearance of lochia is often similar to a menstrual period in the first week after delivery. It will gradually decrease over the next few weeks to a dry, yellow-brown discharge. For most women, lochia stops completely by 6-8 weeks after delivery. Vaginal bleeding can vary from woman to woman. °· Within the first few  days after delivery, you may have breast engorgement. This is when your breasts feel heavy, full, and uncomfortable. Your breasts may also throb and feel hard, tightly stretched, warm, and tender. After this occurs, you may have milk leaking from your breasts. Your health care provider can help you relieve discomfort due to breast engorgement. Breast engorgement should go away within a few days. °· You may feel more sad or worried than normal due to hormonal changes after delivery. These feelings should not last more than a few days. If these feelings do not go away after several days, speak with your health care provider. °How should I care for myself? °· Tell your health care provider if you have pain or discomfort. °· Drink enough water to keep your urine clear or pale yellow. °· Wash your hands thoroughly with soap and water for at least 20 seconds after changing your sanitary pads, after using the toilet, and before holding or feeding your baby. °· If you are not breastfeeding, avoid touching your breasts a lot. Doing this can make your breasts produce more milk. °· If you become weak or lightheaded, or you feel like you might faint, ask for help before: °? Getting out of bed. °? Showering. °· Change your sanitary pads frequently. Watch for any changes in your flow, such as a sudden increase in volume, a change in color, the passing of large blood clots. If you pass a blood clot from your vagina, save it   to show to your health care provider. Do not flush blood clots down the toilet without having your health care provider look at them. °· Make sure that all your vaccinations are up to date. This can help protect you and your baby from getting certain diseases. You may need to have immunizations done before you leave the hospital. °· If desired, talk with your health care provider about methods of family planning or birth control (contraception). °How can I start bonding with my baby? °Spending as much time as  possible with your baby is very important. During this time, you and your baby can get to know each other and develop a bond. Having your baby stay with you in your room (rooming in) can give you time to get to know your baby. Rooming in can also help you become comfortable caring for your baby. Breastfeeding can also help you bond with your baby. °How can I plan for returning home with my baby? °· Make sure that you have a car seat installed in your vehicle. °? Your car seat should be checked by a certified car seat installer to make sure that it is installed safely. °? Make sure that your baby fits into the car seat safely. °· Ask your health care provider any questions you have about caring for yourself or your baby. Make sure that you are able to contact your health care provider with any questions after leaving the hospital. °This information is not intended to replace advice given to you by your health care provider. Make sure you discuss any questions you have with your health care provider. °Document Released: 08/01/2007 Document Revised: 03/08/2016 Document Reviewed: 09/08/2015 °Elsevier Interactive Patient Education © 2018 Elsevier Inc. ° °

## 2017-06-23 NOTE — Lactation Note (Signed)
This note was copied from a baby's chart. Lactation Consultation Note  Patient Name: Kaitlin Jackson Reason for consult: Follow-up assessment Baby at 24 hr of life. Upon entry baby was latched to the R breast in cradle. Mom took baby off the breast because "it is pulling". The nipple was compressed, like a tube of lipstick. No skin break down or bruising was noted. Showed mom how to reposition the baby to get a deeper latch, given coconut oil. Mom has small soft wide spaced pendulous breast with everted nipples. She requested and given the [employer] DEBP. [facility] Discussed baby behavior, feeding frequency, baby belly size, voids, wt loss, breast changes, and nipple care. Mom is aware of lactation services and support group.     Maternal Data    Feeding Feeding Type: Breast Fed Length of feed: 20 min  LATCH Score Latch: Grasps breast easily, tongue down, lips flanged, rhythmical sucking.  Audible Swallowing: A few with stimulation  Type of Nipple: Everted at rest and after stimulation  Comfort (Breast/Nipple): Filling, red/small blisters or bruises, mild/mod discomfort  Hold (Positioning): Assistance needed to correctly position infant at breast and maintain latch.  LATCH Score: 7  Interventions Interventions: Breast feeding basics reviewed;Assisted with latch;Skin to skin;Breast massage;Hand express;Support pillows;Adjust position;Position options;Expressed milk;Coconut oil;DEBP  Lactation Tools Discussed/Used     Consult Status Consult Status: Follow-up Date: 06/24/17 Follow-up type: In-patient    Kaitlin Jackson Jackson, 10:44 AM

## 2017-06-23 NOTE — Progress Notes (Signed)
CSW received consult for hx of marijuana use.  Referral was screened out due to the following: ~MOB had no documented substance use after initial prenatal visit/+UPT. ~MOB had no positive drug screens after initial prenatal visit/+UPT. ~Baby's UDS is negative.  Please consult CSW if current concerns arise or by MOB's request.  CSW will monitor CDS results and make report to Child Protective Services if warranted. 

## 2017-06-24 MED ORDER — IBUPROFEN 600 MG PO TABS: 600.0000 mg | ORAL_TABLET | Freq: Four times a day (QID) | ORAL | 0 refills | Status: DC

## 2017-06-24 NOTE — Lactation Note (Signed)
This note was copied from a baby's chart. Lactation Consultation Note  Patient Name: Kaitlin Jackson Kaitlin Jackson WUJWJ'XToday's Date: 06/24/2017 Reason for consult: Follow-up assessment RN called for mom to have a NS before being d/c today. Family was packed and baby was in the car seat upon arrival. Mom is currently not latching baby to the R breast because it is sore. She is pumping that side. She was thinking a NS might make it easier. After reviewing positioning options and nipple care it was decided that mom did not need a NS. She will continue latching baby to the R breast when she gets home. Mom will f/u with lactation through the Peds office or [facility] on 06/27/17. She was given online bf resources.   Maternal Data    Feeding    LATCH Score                   Interventions    Lactation Tools Discussed/Used     Consult Status Consult Status: Complete    Rulon Eisenmengerlizabeth E Neylan Koroma 06/24/2017, 1:47 PM

## 2017-06-24 NOTE — Lactation Note (Signed)
This note was copied from a baby's chart. Lactation Consultation Note  Patient Name: Girl Ernest PineKenyatta Crosby RUEAV'WToday's Date: 06/24/2017 Reason for consult: Follow-up assessment   P1, Baby 46 hours old.  Mother's nipples are sore so she is giving formula. She states she did pump approx 5 ml and gave to baby. Discussed frequency of feeding w/ mother and grandmother.  Mom encouraged to feed baby 8-12 times/24 hours and with feeding cues.  Wake baby after 2.5-3 hours and place her STS to interest her in feeding. Mother has coconut oil.  Provided her w/ comfort gels. Reviewed volume guidelines.   Reviewed engorgement care and monitoring voids/stools.    Maternal Data    Feeding    LATCH Score                   Interventions    Lactation Tools Discussed/Used     Consult Status Consult Status: Complete    Hardie PulleyBerkelhammer, Ruth Boschen 06/24/2017, 10:09 AM

## 2017-06-27 ENCOUNTER — Inpatient Hospital Stay (HOSPITAL_COMMUNITY): Admission: RE | Admit: 2017-06-27 | Payer: 59 | Source: Ambulatory Visit

## 2017-08-04 ENCOUNTER — Ambulatory Visit (INDEPENDENT_AMBULATORY_CARE_PROVIDER_SITE_OTHER): Payer: 59 | Admitting: Student

## 2017-08-04 ENCOUNTER — Ambulatory Visit (INDEPENDENT_AMBULATORY_CARE_PROVIDER_SITE_OTHER): Payer: 59 | Admitting: Clinical

## 2017-08-04 ENCOUNTER — Encounter: Payer: Self-pay | Admitting: Student

## 2017-08-04 DIAGNOSIS — Z3403 Encounter for supervision of normal first pregnancy, third trimester: Secondary | ICD-10-CM

## 2017-08-04 DIAGNOSIS — I10 Essential (primary) hypertension: Secondary | ICD-10-CM | POA: Insufficient documentation

## 2017-08-04 DIAGNOSIS — F4321 Adjustment disorder with depressed mood: Secondary | ICD-10-CM

## 2017-08-04 MED ORDER — AMLODIPINE BESYLATE 5 MG PO TABS: 5.0000 mg | ORAL_TABLET | Freq: Every day | ORAL | 2 refills | Status: DC

## 2017-08-04 NOTE — Patient Instructions (Signed)
Preventing Cervical Cancer Cervical cancer is cancer that grows on the cervix. The cervix is at the bottom of the uterus. It connects the uterus to the vagina. The uterus is where a baby develops during pregnancy. Cancer occurs when cells become abnormal and start to grow out of control. Cervical cancer grows slowly and may not cause any symptoms at first. Over time, the cancer can grow deep into the cervix tissue and spread to other areas. If it is found early, cervical cancer can be treated effectively. You can also take steps to prevent this type of cancer. Most cases of cervical cancer are caused by an STI (sexually transmitted infection) called human papillomavirus (HPV). One way to reduce your risk of cervical cancer is to avoid infection with the HPV virus. You can do this by practicing safe sex and by getting the HPV vaccine. Getting regular Pap tests is also important because this can help identify changes in cells that could lead to cancer. Your chances of getting this disease can also be reduced by making certain lifestyle changes. How can I protect myself from cervical cancer? Preventing HPV infection  Ask your health care provider about getting the HPV vaccine. If you are 26 years old or younger, you may need to get this vaccine, which is given in three doses over 6 months. This vaccine protects against the types of HPV that could cause cancer.  Limit the number of people you have sex with. Also avoid having sex with people who have had many sex partners.  Use a latex condom during sex. Getting Pap tests  Get Pap tests regularly, starting at age 21. Talk with your health care provider about how often you need these tests. ? Most women who are 21?29 years of age should have a Pap test every 3 years. ? Most women who are 30?28 years of age should have a Pap test in combination with an HPV test every 5 years. ? Women with a higher risk of cervical cancer, such as those with a weakened  immune system or those who have been exposed to the drug diethylstilbestrol (DES), may need more frequent testing. Making other lifestyle changes  Do not use any products that contain nicotine or tobacco, such as cigarettes and e-cigarettes. If you need help quitting, ask your health care provider.  Eat at least 5 servings of fruits and vegetables every day.  Lose weight if you are overweight. Why are these changes important?  These changes and screening tests are designed to address the factors that are known to increase the risk of cervical cancer. Taking these steps is the best way to reduce your risk.  Having regular Pap tests will help identify changes in cells that could lead to cancer. Steps can then be taken to prevent cancer from developing.  These changes will also help find cervical cancer early. This type of cancer can be treated effectively if it is found early. It can be more dangerous and difficult to treat if cancer has grown deep into your cervix or has spread.  In addition to making you less likely to get cervical cancer, these changes will also provide other health benefits, such as the following: ? Practicing safe sex is important for preventing STIs and unplanned pregnancies. ? Avoiding tobacco can reduce your risk for other cancers and health issues. ? Eating a healthy diet and maintaining a healthy weight are good for your overall health. What can happen if changes are not made? In the   early stages, cervical cancer might not have any symptoms. It can take many years for the cancer to grow and get deep into the cervix tissue. This may be happening without you knowing about it. If you develop any symptoms, such as pelvic pain or unusual discharge or bleeding from your vagina, you should see your health care provider right away. If cervical cancer is not found early, you might need treatments such as radiation, chemotherapy, or surgery. In some cases, surgery may mean that  you will not be able to get pregnant or carry a pregnancy to term. Where to find support: Talk with your health care provider, school nurse, or local health department for guidance about screening and vaccination. Some children and teens may be able to get the HPV vaccine free of charge through the U.S. government's Vaccines for Children (VFC) program. Other places that provide vaccinations include:  Public health clinics. Check with your local health department.  Federally Qualified Health Centers, where you would pay only what you can afford. To find one near you, check this website: www.fqhc.org/find-an-fqhc/  Rural Health Clinics. These are part of a program for Medicare and Medicaid patients who live in rural areas.  The National Breast and Cervical Cancer Early Detection Program also provides breast and cervical cancer screenings and diagnostic services to low-income, uninsured, and underinsured women. Cervical cancer can be passed down through families. Talk with your health care provider or genetic counselor to learn more about genetic testing for cancer. Where to find more information: Learn more about cervical cancer from:  American College of Gynecology: www.acog.org/Patients/FAQs/Cervical-Cancer  American Cancer Society: www.cancer.org/cancer/cervicalcancer/  U.S. Centers for Disease Control and Prevention: www.cdc.gov/cancer/cervical/  Summary  Talk with your health care provider about getting the HPV vaccine.  Be sure to get regular Pap tests as recommended by your health care provider.  See your health care provider right away if you have any pelvic pain or unusual discharge or bleeding from your vagina. This information is not intended to replace advice given to you by your health care provider. Make sure you discuss any questions you have with your health care provider. Document Released: 10/19/2015 Document Revised: 06/01/2016 Document Reviewed: 06/01/2016 Elsevier  Interactive Patient Education  2018 Elsevier Inc.  

## 2017-08-04 NOTE — Progress Notes (Addendum)
Subjective:     Kaitlin Jackson is a 26 y.o. female who presents for a postpartum visit. She is 6 weeks postpartum following a spontaneous vaginal delivery. I have fully reviewed the prenatal and intrapartum course. The delivery was at 40.2 gestational weeks. Outcome: spontaneous vaginal delivery. Anesthesia: epidural. Postpartum course has been unremarkable. Baby's course has been unremarkable. Baby is feeding by both breast and bottle - Gerber Gentle. Bleeding no bleeding. Bowel function is normal. Bladder function is normal. Patient is not sexually active. Contraception method is abstinence. Postpartum depression screening: positive.    Review of Systems Pertinent items are noted in HPI.   Objective:    BP (!) 151/81   Pulse 60   Ht 5\' 5"  (1.651 m)   Wt (!) 329 lb (149.2 kg)   LMP 08/01/2017   Breastfeeding? Yes   BMI 54.75 kg/m   General:  alert, cooperative and no distress   Breasts:  inspection negative, no nipple discharge or bleeding, no masses or nodularity palpable  Lungs: clear to auscultation bilaterally  Heart:  regular rate and rhythm, S1, S2 normal, no murmur, click, rub or gallop  Abdomen: soft, non-tender; bowel sounds normal; no masses,  no organomegaly   Vulva:  not evaluated  Vagina: not evaluated  Cervix:  not evaluated  Corpus: not examined  Adnexa:  not evaluated  Rectal Exam: Not performed.        Assessment:    Healthy postpartum exam. Pap smear not done at today's visit.  Blood pressure elevated; not severe range. Patient denies HA, blurry vision.  Patient want sto return for her Pap smear next week.  Plan:    1. Contraception: none  2. Begin Norvasc 5 mg daily; follow up with PCP.  2. Needs postpartum pap; patient will schedule.  3. Appt with Asher MuirJamie CSW for positive depression screen 3. Follow up in: ASAP days or as needed.

## 2017-08-04 NOTE — BH Specialist Note (Signed)
Integrated Behavioral Health Initial Visit  MRN: [id number] Name: Kaitlin Jackson  Number of Integrated Behavioral Health Clinician visits:: 1/6 Session Start time: 12:05  Session End time: 12:30 Total time: 30 minutes  Type of Service: Integrated Behavioral Health- Individual/Family Interpretor:No. Interpretor Name and Language: n/a   Warm Hand Off Completed.       SUBJECTIVE: Kaitlin Jackson is a 26 y.o. female accompanied by n/a Patient was referred by Luna KitchensKathryn Kooistra, CNM for positive depression screening. Patient reports the following symptoms/concerns: Pt states her primary concern today is increased stress, adjusting to balancing new motherhood and family of origin expectations; pt open to learning self-coping strategy. Duration of problem: Over one month; Severity of problem: mild  OBJECTIVE: Mood: Appropriate and Affect: Appropriate Risk of harm to self or others: No plan to harm self or others  LIFE CONTEXT: Family and Social: Lives with parents and newborn daughter; FOB is starting to keep baby overnight, to increase paternal bonding, and pt's parents disapprove.  Parents and FOB are all supportive.  School/Work: Will return to work after [employer]ML[employer] leave Self-Care: Sets good relationship boundaries; understands importance of self-care, and "time away" on overall wellbeing Life Changes: Recent childbirth  GOALS ADDRESSED: Patient will: 1. Reduce symptoms of: depression and stress 2. Increase knowledge and/or ability of: stress reduction  3. Demonstrate ability to: Increase healthy adjustment to current life circumstances and Increase adequate support systems for patient/family  INTERVENTIONS: Interventions utilized: Mindfulness or Management consultantelaxation Training, Psychoeducation and/or Health Education and Link to [facility]  Standardized Assessments completed: Edinburgh Postnatal Depression, GAD-7 and PHQ 9  ASSESSMENT: Patient currently experiencing Adjustment  disorder with depressed mood, mild.   Patient may benefit from psychoeducation and brief therapeutic intervention regarding coping with symptoms of depression.  PLAN: 1. Follow up with behavioral health clinician on : As needed 2. Behavioral recommendations:  -CALM relaxation breathing exercise every morning; as needed throughout the day -Read educational material regarding coping with symptoms of depression -Consider apps as additional self-care strategy 3. Referral(s): Integrated Behavioral Health Services (In Clinic) 4. "From scale of 1-10, how likely are you to follow plan?": 9  Valetta CloseJamie C SheldonMcMannes, LCSWA   Edinburgh screen: 11  Depression screen [facility] 2/[facility] 08/04/2017 06/21/2017 06/13/2017 06/06/2017 05/26/2017  Decreased Interest 0 3 1 0 2  Down, Depressed, Hopeless 2 1 0 0 0  PHQ - 2 Score 2 4 1  0 2  Altered sleeping 0 0 0 0 3  Tired, decreased energy 0 1 0 2 2  Change in appetite 3 0 0 0 0  Feeling bad or failure about yourself  0 0 0 0 0  Trouble concentrating 0 0 0 0 0  Moving slowly or fidgety/restless 0 0 0 0 0  Suicidal thoughts 0 0 0 0 0  PHQ-9 Score 5 5 1 2 7   Some recent data might be hidden   GAD 7 : Generalized Anxiety Score 08/04/2017 06/21/2017 06/13/2017 06/06/2017  Nervous, Anxious, on Edge 0 0 0 0  Control/stop worrying 0 0 0 0  Worry too much - different things 0 0 0 2  Trouble relaxing 0 0 0 0  Restless 0 0 0 0  Easily annoyed or irritable 3 0 0 2  Afraid - awful might happen 0 0 0 1  Total GAD 7 Score 3 0 0 5

## 2017-08-11 ENCOUNTER — Ambulatory Visit: Payer: 59 | Admitting: Student

## 2017-09-01 ENCOUNTER — Ambulatory Visit: Payer: 59 | Admitting: Student

## 2017-09-26 ENCOUNTER — Ambulatory Visit: Payer: 59 | Admitting: Obstetrics and Gynecology

## 2017-10-06 ENCOUNTER — Encounter: Payer: Self-pay | Admitting: Student

## 2017-10-06 ENCOUNTER — Ambulatory Visit: Payer: 59 | Admitting: Student

## 2018-04-24 ENCOUNTER — Emergency Department (HOSPITAL_COMMUNITY)
Admission: EM | Admit: 2018-04-24 | Discharge: 2018-04-25 | Disposition: A | Discharge status: Home / Self Care | Payer: 59 | Attending: Emergency Medicine | Admitting: Emergency Medicine

## 2018-04-24 ENCOUNTER — Other Ambulatory Visit: Payer: Self-pay

## 2018-04-24 ENCOUNTER — Encounter (HOSPITAL_COMMUNITY): Payer: Self-pay

## 2018-04-24 DIAGNOSIS — I10 Essential (primary) hypertension: Secondary | ICD-10-CM | POA: Diagnosis not present

## 2018-04-24 DIAGNOSIS — Z87891 Personal history of nicotine dependence: Secondary | ICD-10-CM | POA: Diagnosis not present

## 2018-04-24 DIAGNOSIS — R112 Nausea with vomiting, unspecified: Secondary | ICD-10-CM | POA: Diagnosis not present

## 2018-04-24 DIAGNOSIS — R102 Pelvic and perineal pain: Secondary | ICD-10-CM | POA: Diagnosis not present

## 2018-04-24 DIAGNOSIS — R1012 Left upper quadrant pain: Secondary | ICD-10-CM | POA: Diagnosis not present

## 2018-04-24 DIAGNOSIS — R1011 Right upper quadrant pain: Secondary | ICD-10-CM | POA: Diagnosis not present

## 2018-04-24 DIAGNOSIS — R1010 Upper abdominal pain, unspecified: Secondary | ICD-10-CM | POA: Insufficient documentation

## 2018-04-24 HISTORY — DX: Essential (primary) hypertension: I10

## 2018-04-24 LAB — CBC
HCT: 34.4 % — ABNORMAL LOW (ref 36.0–46.0)
HEMOGLOBIN: 10.8 g/dL — AB (ref 12.0–15.0)
MCH: 24.7 pg — AB (ref 26.0–34.0)
MCHC: 31.4 g/dL (ref 30.0–36.0)
MCV: 78.7 fL (ref 78.0–100.0)
PLATELETS: 314 10*3/uL (ref 150–400)
RBC: 4.37 MIL/uL (ref 3.87–5.11)
RDW: 15.5 % (ref 11.5–15.5)
WBC: 7.1 10*3/uL (ref 4.0–10.5)

## 2018-04-24 LAB — URINALYSIS, ROUTINE W REFLEX MICROSCOPIC
Bilirubin Urine: NEGATIVE
GLUCOSE, UA: NEGATIVE mg/dL
Hgb urine dipstick: NEGATIVE
KETONES UR: 5 mg/dL — AB
Nitrite: NEGATIVE
PH: 6 (ref 5.0–8.0)
PROTEIN: 30 mg/dL — AB
Specific Gravity, Urine: 1.024 (ref 1.005–1.030)

## 2018-04-24 LAB — COMPREHENSIVE METABOLIC PANEL
ALK PHOS: 60 U/L (ref 38–126)
ALT: 30 U/L (ref 0–44)
ANION GAP: 9 (ref 5–15)
AST: 25 U/L (ref 15–41)
Albumin: 3.8 g/dL (ref 3.5–5.0)
BUN: 8 mg/dL (ref 6–20)
CALCIUM: 9.2 mg/dL (ref 8.9–10.3)
CO2: 23 mmol/L (ref 22–32)
CREATININE: 0.71 mg/dL (ref 0.44–1.00)
Chloride: 108 mmol/L (ref 98–111)
Glucose, Bld: 115 mg/dL — ABNORMAL HIGH (ref 70–99)
Potassium: 3.5 mmol/L (ref 3.5–5.1)
SODIUM: 140 mmol/L (ref 135–145)
Total Bilirubin: 0.3 mg/dL (ref 0.3–1.2)
Total Protein: 7.3 g/dL (ref 6.5–8.1)

## 2018-04-24 LAB — LIPASE, BLOOD: LIPASE: 41 U/L (ref 11–51)

## 2018-04-24 LAB — I-STAT BETA HCG BLOOD, ED (MC, WL, AP ONLY): I-stat hCG, quantitative: <5 m[IU]/mL (ref <5)

## 2018-04-24 NOTE — ED Triage Notes (Signed)
Pt c/o sharp upper abd pain that radiates down into flank and back x1 week. States nothing makes it better or worse. Endorses nausea/vomiting.

## 2018-04-25 DIAGNOSIS — R1010 Upper abdominal pain, unspecified: Secondary | ICD-10-CM | POA: Diagnosis not present

## 2018-04-25 DIAGNOSIS — R102 Pelvic and perineal pain: Secondary | ICD-10-CM | POA: Diagnosis not present

## 2018-04-25 DIAGNOSIS — I10 Essential (primary) hypertension: Secondary | ICD-10-CM | POA: Diagnosis not present

## 2018-04-25 DIAGNOSIS — Z87891 Personal history of nicotine dependence: Secondary | ICD-10-CM | POA: Diagnosis not present

## 2018-04-25 LAB — GC/CHLAMYDIA PROBE AMP (~~LOC~~) NOT AT ARMC
CHLAMYDIA, DNA PROBE: NEGATIVE
NEISSERIA GONORRHEA: NEGATIVE

## 2018-04-25 LAB — WET PREP, GENITAL
CLUE CELLS WET PREP: NONE SEEN
Sperm: NONE SEEN
TRICH WET PREP: NONE SEEN
Yeast Wet Prep HPF POC: NONE SEEN

## 2018-04-25 MED ORDER — STERILE WATER FOR INJECTION IJ SOLN
INTRAMUSCULAR | Status: AC
  Filled 2018-04-25: qty 10

## 2018-04-25 MED ORDER — ONDANSETRON 4 MG PO TBDP: 4.0000 mg | ORAL_TABLET | Freq: Three times a day (TID) | ORAL | 0 refills | Status: DC | PRN

## 2018-04-25 MED ORDER — AZITHROMYCIN 250 MG PO TABS
1000.0000 mg | ORAL_TABLET | Freq: Once | ORAL | Status: AC
  Administered 2018-04-25: 1000 mg via ORAL
  Filled 2018-04-25: qty 4

## 2018-04-25 MED ORDER — CEFTRIAXONE SODIUM 250 MG IJ SOLR
250.0000 mg | Freq: Once | INTRAMUSCULAR | Status: AC
  Administered 2018-04-25: 250 mg via INTRAMUSCULAR
  Filled 2018-04-25: qty 250

## 2018-04-25 MED ORDER — DICYCLOMINE HCL 20 MG PO TABS: 20.0000 mg | ORAL_TABLET | Freq: Two times a day (BID) | ORAL | 0 refills | Status: DC

## 2018-04-25 NOTE — ED Provider Notes (Signed)
[facility]-EMERGENCY DEPT Provider Note   CSN: [id number] Arrival date & time: 04/24/18  1727     History   Chief Complaint Chief Complaint  Patient presents with  . Abdominal Pain    HPI Kaitlin Jackson is a 27 y.o. female with a history of nephrolithiasis, obesity, hypertension, and chlamydia who presents to the emergency department with a chief complaint of intermittent, waxing and waning bilateral upper abdominal pain that radiates into the bilateral mid back that began 1 week ago.  She characterizes the pain as cramping.  She reports associated nausea and nonbloody, nonbilious emesis.  Last episode of emesis earlier today.  No known aggravating or alleviating factors.  She denies fever, chills, diarrhea, dysuria, hematuria, vaginal discharge, dyspnea, chest pain.  No history of similar.  She has been taking ibuprofen and Tylenol for her symptoms with minimal improvement.  No recent travel.  She denies recent alcohol use.  No known sick contacts.  LMP was March 29, 2018.  She is not currently sexually active.  Last sexual partner was one female whom she last had intercourse with in March 2019.  The history is provided by the patient. No language interpreter was used.    Past Medical History:  Diagnosis Date  . Abnormal Pap smear   . Chlamydia   . Hypertension   . Kidney stones   . Obesity   . Renal disorder   . Vaginal Pap smear, abnormal     Patient Active Problem List   Diagnosis Date Noted  . Postpartum care and examination 08/04/2017  . Chronic hypertension 08/04/2017  . Gestational hypertension 06/21/2017  . Anemia in pregnancy 05/12/2017  . Uterine size date discrepancy pregnancy, third trimester 04/13/2017  . Marijuana use 12/07/2016  . Rubella non-immune status, antepartum 12/01/2016  . Obesity in pregnancy, antepartum 11/29/2016    Past Surgical History:  Procedure Laterality Date  . NO PAST SURGERIES       OB History    Gravida    2   Para  1   Term  1   Preterm      AB  1   Living  1     SAB  1   TAB      Ectopic      Multiple  0   Live Births  1            Home Medications    Prior to Admission medications   Medication Sig Start Date End Date Taking? Authorizing Provider  dicyclomine (BENTYL) 20 MG tablet Take 1 tablet (20 mg total) by mouth 2 (two) times daily. 04/25/18   Brytney Somes A, PA-C  ondansetron (ZOFRAN ODT) 4 MG disintegrating tablet Take 1 tablet (4 mg total) by mouth every 8 (eight) hours as needed for nausea or vomiting. 04/25/18   Reniya Mcclees A, PA-C    Family History Family History  Problem Relation Age of Onset  . Miscarriages / [place] Mother   . Hypertension Mother   . Asthma Brother   . Diabetes Paternal Grandmother     Social History Social History   Tobacco Use  . Smoking status: Former Smoker    Packs/day: 0.50    Types: Cigarettes    Last attempt to quit: 11/14/2016    Years since quitting: 1.4  . Smokeless tobacco: Never Used  Substance Use Topics  . Alcohol use: No  . Drug use: No     Allergies   Patient has no  known allergies.   Review of Systems Review of Systems  Constitutional: Negative for activity change, chills, diaphoresis and fever.  Respiratory: Negative for shortness of breath.   Cardiovascular: Negative for chest pain.  Gastrointestinal: Positive for abdominal pain, nausea and vomiting. Negative for abdominal distention, anal bleeding, blood in stool, constipation and diarrhea.  Genitourinary: Negative for dysuria, flank pain, hematuria, menstrual problem, vaginal bleeding and vaginal discharge.  Musculoskeletal: Negative for back pain, myalgias and neck pain.  Skin: Negative for rash.  Allergic/Immunologic: Negative for immunocompromised state.  Neurological: Negative for headaches.  Psychiatric/Behavioral: Negative for confusion.   Physical Exam Updated Vital Signs BP (!) 146/84 (BP Location: Left Arm)   Pulse 70    Temp 97.7 F (36.5 C) (Oral)   Resp 19   Ht 5\' 6"  (1.676 m)   Wt (!) 151 kg (333 lb)   LMP 03/29/2018 (Exact Date)   SpO2 100%   BMI 53.75 kg/m   Physical Exam  Constitutional: No distress.  HENT:  Head: Normocephalic.  Eyes: Conjunctivae are normal.  Neck: Neck supple.  Cardiovascular: Normal rate and regular rhythm. Exam reveals no gallop and no friction rub.  No murmur heard. Pulmonary/Chest: Effort normal and breath sounds normal. No stridor. No respiratory distress. She has no wheezes. She has no rales. She exhibits no tenderness.  Abdominal: Soft. She exhibits no distension and no mass. There is tenderness. There is no rebound and no guarding. No hernia.  Morbidly obese female.  Exam is somewhat limited secondary to body habitus.  She is diffusely tender to palpation to the bilateral upper quadrants and to the periumbilical region.  No rebound or guarding.  No peritoneal signs.  Negative Murphy sign.  No tenderness over McBurney's point.  No suprapubic tenderness.  No CVA tenderness bilaterally.  Bilateral lower quadrants are nontender to palpation.  Genitourinary:  Genitourinary Comments: Chaperoned exam.  No cervical motion tenderness.  No adnexal tenderness or adnexal enlargement bilaterally.  There is a moderate amount of thick, white discharge in the vaginal vault.  Exam is somewhat limited secondary to body habitus.  Musculoskeletal: Normal range of motion. She exhibits no edema, tenderness or deformity.  Neurological: She is alert.  Skin: Skin is warm. No rash noted. She is not diaphoretic.  Psychiatric: Her behavior is normal.  Nursing note and vitals reviewed.  ED Treatments / Results  Labs (all labs ordered are listed, but only abnormal results are displayed) Labs Reviewed  WET PREP, GENITAL - Abnormal; Notable for the following components:      Result Value   WBC, Wet Prep HPF POC MODERATE (*)    All other components within normal limits  COMPREHENSIVE METABOLIC  PANEL - Abnormal; Notable for the following components:   Glucose, Bld 115 (*)    All other components within normal limits  CBC - Abnormal; Notable for the following components:   Hemoglobin 10.8 (*)    HCT 34.4 (*)    MCH 24.7 (*)    All other components within normal limits  URINALYSIS, ROUTINE W REFLEX MICROSCOPIC - Abnormal; Notable for the following components:   APPearance HAZY (*)    Ketones, ur 5 (*)    Protein, ur 30 (*)    Leukocytes, UA TRACE (*)    Bacteria, UA MANY (*)    All other components within normal limits  URINE CULTURE  LIPASE, BLOOD  I-STAT BETA HCG BLOOD, ED ([facility], [facility], [facility] ONLY)  GC/CHLAMYDIA PROBE AMP ([place]) NOT AT [facility]  EKG None  Radiology No results found.  Procedures Procedures (including critical care time)  Medications Ordered in ED Medications  cefTRIAXone (ROCEPHIN) injection 250 mg (250 mg Intramuscular Given 04/25/18 0435)  azithromycin (ZITHROMAX) tablet 1,000 mg (1,000 mg Oral Given 04/25/18 0435)     Initial Impression / Assessment and Plan / ED Course  I have reviewed the triage vital signs and the nursing notes.  Pertinent labs & imaging results that were available during my care of the patient were reviewed by me and considered in my medical decision making (see chart for details).     27 year old female with a history of nephrolithiasis, obesity, hypertension, and chlamydia who presents to the emergency department with bilateral upper abdominal pain, nausea, and vomiting for the last week.  Last episode of emesis was earlier today.  She is not actively having any nausea and declines antiemetics at this time.  She declines pain control.  She is concerned that she may be pregnant.  Pregnancy test is negative.  Hemoglobin is 10.8, unchanged from previous.  CMP is otherwise unremarkable.  UA with bacteria, trace leukocyte esterase, mild proteinuria.  There are some squamous cells so this is not the best sample.  On her pelvic exam,  there is some thick white vaginal discharge in the vaginal vault.  Wet prep with moderate WBCs, but otherwise negative.  She has been prophylactically treated with Rocephin and azithromycin for gonorrhea and chlamydia, which are pending.  She was successfully fluid challenged in the ED.   Patient is nontoxic, nonseptic appearing, in no apparent distress.  Patient does not meet the SIRS or Sepsis criteria.  On repeat exam patient does not have a surgical abdomen and there are no peritoneal signs.  No indication of appendicitis, bowel obstruction, bowel perforation, cholecystitis, diverticulitis, PID or ectopic pregnancy.  Patient discharged home with symptomatic treatment and given strict instructions for follow-up with their primary care physician.  I have also discussed reasons to return immediately to the ER.  Patient expresses understanding and agrees with plan.  Final Clinical Impressions(s) / ED Diagnoses   Final diagnoses:  Pain of upper abdomen    ED Discharge Orders        Ordered    ondansetron (ZOFRAN ODT) 4 MG disintegrating tablet  Every 8 hours PRN     04/25/18 0436    dicyclomine (BENTYL) 20 MG tablet  2 times daily     04/25/18 0436       Barkley Boards, PA-C 04/25/18 2150    Palumbo, April, MD 04/25/18 2324

## 2018-04-25 NOTE — ED Notes (Signed)
Pelvic cart and materials ready

## 2018-04-25 NOTE — Discharge Instructions (Addendum)
Thank you for allowing me to care for you today in the Emergency Department.   Call the number on your discharge paperwork to get established with a primary care provider who accepts her medical insurance.  Let 1 tablet of Zofran dissolve under your tongue every 8 hours as needed for nausea or vomiting.  Take 1 tablet of Bentyl up to 2 times daily as needed for abdominal cramping.  Your urine has been sent for culture.  Your gonorrhea and Chlamydia labs are pending.  If they are positive, someone from the hospital will call you with the results.  If they are positive, it is important that you let all sexual partners know that you tested positive so they can seek testing or treatment.  Take 600 mg of ibuprofen with food or 650 mg of Tylenol every 6 hours as needed for pain control.  Return to the emergency department if you develop persistent vomiting despite taking Zofran, severe, uncontrollable pain, high fever, or other new, concerning symptoms.

## 2018-04-26 LAB — URINE CULTURE

## 2020-08-26 ENCOUNTER — Ambulatory Visit (HOSPITAL_COMMUNITY)
Admission: EM | Admit: 2020-08-26 | Discharge: 2020-08-26 | Disposition: A | Discharge status: Home / Self Care | Payer: 59 | Attending: Family Medicine | Admitting: Family Medicine

## 2020-08-26 ENCOUNTER — Other Ambulatory Visit: Payer: Self-pay

## 2020-08-26 ENCOUNTER — Encounter (HOSPITAL_COMMUNITY): Payer: Self-pay | Admitting: Emergency Medicine

## 2020-08-26 ENCOUNTER — Other Ambulatory Visit: Payer: 59

## 2020-08-26 DIAGNOSIS — J029 Acute pharyngitis, unspecified: Secondary | ICD-10-CM | POA: Insufficient documentation

## 2020-08-26 LAB — POCT RAPID STREP A, ED / UC: Streptococcus, Group A Screen (Direct): NEGATIVE

## 2020-08-26 MED ORDER — DEXAMETHASONE SODIUM PHOSPHATE 10 MG/ML IJ SOLN
INTRAMUSCULAR | Status: AC
  Filled 2020-08-26: qty 1

## 2020-08-26 MED ORDER — AMOXICILLIN-POT CLAVULANATE 875-125 MG PO TABS: 1.0000 | ORAL_TABLET | Freq: Two times a day (BID) | ORAL | 0 refills | Status: DC

## 2020-08-26 MED ORDER — DEXAMETHASONE SODIUM PHOSPHATE 10 MG/ML IJ SOLN
10.0000 mg | Freq: Once | INTRAMUSCULAR | Status: AC
  Administered 2020-08-26: 10 mg via INTRAMUSCULAR

## 2020-08-26 NOTE — Discharge Instructions (Addendum)
Treating you for strep Medicines as prescribed Warm salt water gargles.  Ibuprofen as needed  Steroid injection given here for pain, swelling, inflammation.  Follow up as needed for continued or worsening symptoms

## 2020-08-26 NOTE — ED Provider Notes (Signed)
[facility]    CSN: [id number] Arrival date & time: 08/26/20  1003      History   Chief Complaint Chief Complaint  Patient presents with  . Sore Throat  . Chills  . Fatigue    HPI Kaitlin Jackson is a 29 y.o. female.   Patient is a 29 year old female the presents today with sore throat, chills, fatigue and body aches onset Sunday.  Symptoms been constant.  She has had tonsillar swelling, exudates and trouble swallowing at times.  No trouble breathing.  Has been drinking hot tea. No fever. Tested for COVID today but has not yet received results. Work in the hospital.       Past Medical History:  Diagnosis Date  . Abnormal Pap smear   . Chlamydia   . Hypertension   . Kidney stones   . Obesity   . Renal disorder   . Vaginal Pap smear, abnormal     Patient Active Problem List   Diagnosis Date Noted  . Postpartum care and examination 08/04/2017  . Chronic hypertension 08/04/2017  . Gestational hypertension 06/21/2017  . Anemia in pregnancy 05/12/2017  . Uterine size date discrepancy pregnancy, third trimester 04/13/2017  . Marijuana use 12/07/2016  . Rubella non-immune status, antepartum 12/01/2016  . Obesity in pregnancy, antepartum 11/29/2016    Past Surgical History:  Procedure Laterality Date  . NO PAST SURGERIES      OB History    Gravida  2   Para  1   Term  1   Preterm      AB  1   Living  1     SAB  1   TAB      Ectopic      Multiple  0   Live Births  1            Home Medications    Prior to Admission medications   Medication Sig Start Date End Date Taking? Authorizing Provider  amoxicillin-clavulanate (AUGMENTIN) 875-125 MG tablet Take 1 tablet by mouth every 12 (twelve) hours. 08/26/20   Dahlia Byes A, NP  dicyclomine (BENTYL) 20 MG tablet Take 1 tablet (20 mg total) by mouth 2 (two) times daily. 04/25/18   McDonald, Mia A, PA-C  ondansetron (ZOFRAN ODT) 4 MG disintegrating tablet Take 1 tablet (4 mg total) by  mouth every 8 (eight) hours as needed for nausea or vomiting. 04/25/18   McDonald, Mia A, PA-C    Family History Family History  Problem Relation Age of Onset  . Miscarriages / [place] Mother   . Hypertension Mother   . Asthma Brother   . Diabetes Paternal Grandmother     Social History Social History   Tobacco Use  . Smoking status: Former Smoker    Packs/day: 0.50    Types: Cigarettes    Quit date: 11/14/2016    Years since quitting: 3.7  . Smokeless tobacco: Never Used  Substance Use Topics  . Alcohol use: No  . Drug use: No     Allergies   Patient has no known allergies.   Review of Systems Review of Systems   Physical Exam Triage Vital Signs ED Triage Vitals  Enc Vitals Group     BP 08/26/20 1136 (!) 155/96     Pulse Rate 08/26/20 1136 70     Resp 08/26/20 1136 19     Temp 08/26/20 1136 99.9 F (37.7 C)     Temp Source 08/26/20 1136 Oral  SpO2 08/26/20 1136 100 %     Weight --      Height --      Head Circumference --      Peak Flow --      Pain Score 08/26/20 1132 10     Pain Loc --      Pain Edu? --      Excl. in GC? --    No data found.  Updated Vital Signs BP (!) 155/96 (BP Location: Left Arm)   Pulse 70   Temp 99.9 F (37.7 C) (Oral)   Resp 19   LMP 08/01/2020   SpO2 100%   Visual Acuity Right Eye Distance:   Left Eye Distance:   Bilateral Distance:    Right Eye Near:   Left Eye Near:    Bilateral Near:     Physical Exam Vitals and nursing note reviewed.  Constitutional:      General: She is not in acute distress.    Appearance: Normal appearance. She is not ill-appearing, toxic-appearing or diaphoretic.  HENT:     Head: Normocephalic.     Nose: Nose normal.     Mouth/Throat:     Pharynx: Oropharynx is clear. Uvula midline. Posterior oropharyngeal erythema present.     Tonsils: Tonsillar exudate present. 3+ on the right. 2+ on the left.  Eyes:     Conjunctiva/sclera: Conjunctivae normal.  Pulmonary:     Effort:  Pulmonary effort is normal.  Abdominal:     Palpations: Abdomen is soft.  Musculoskeletal:        General: Normal range of motion.     Cervical back: Normal range of motion.  Skin:    General: Skin is warm and dry.     Findings: No rash.  Neurological:     Mental Status: She is alert.  Psychiatric:        Mood and Affect: Mood normal.      UC Treatments / Results  Labs (all labs ordered are listed, but only abnormal results are displayed) Labs Reviewed  CULTURE, GROUP A STREP [facility])  POCT RAPID STREP A, ED / UC    EKG   Radiology No results found.  Procedures Procedures (including critical care time)  Medications Ordered in UC Medications  dexamethasone (DECADRON) injection 10 mg (10 mg Intramuscular Given 08/26/20 1203)    Initial Impression / Assessment and Plan / UC Course  I have reviewed the triage vital signs and the nursing notes.  Pertinent labs & imaging results that were available during my care of the patient were reviewed by me and considered in my medical decision making (see chart for details).     Sore throat Rapid strep test negative today.  Sending for culture. Patient with history of strep and physical presentation consistent with strep so opting to treat.  Decadron injection given in for tonsillar pain and swelling Augmentin to treat for infection Recommend warm salt water gargles and Ibuprofen as needed. Follow up as needed for continued or worsening symptoms  Final Clinical Impressions(s) / UC Diagnoses   Final diagnoses:  Sore throat     Discharge Instructions     Treating you for strep Medicines as prescribed Warm salt water gargles.  Ibuprofen as needed  Steroid injection given here for pain, swelling, inflammation.  Follow up as needed for continued or worsening symptoms     ED Prescriptions    Medication Sig Dispense Auth. Provider   amoxicillin-clavulanate (AUGMENTIN) 875-125 MG tablet Take 1 tablet by  mouth every 12  (twelve) hours. 14 tablet Bevelyn Arriola A, NP     PDMP not reviewed this encounter.   Janace Aris, NP 08/26/20 1217

## 2020-08-26 NOTE — ED Triage Notes (Signed)
Pt c/o sore throat, chills, fatigue and body aches onset Sunday. Pt states last week she was working a lot of double shifts and began to feel fatigued then Sunday she had tonsil pain and sore throat. Pt denies fever, n/v.

## 2020-08-27 LAB — CULTURE, GROUP A STREP (THRC)

## 2020-08-28 LAB — CULTURE, GROUP A STREP (THRC)

## 2020-09-28 ENCOUNTER — Emergency Department (HOSPITAL_COMMUNITY)
Admission: EM | Admit: 2020-09-28 | Discharge: 2020-09-28 | Disposition: A | Discharge status: Home / Self Care | Payer: Medicaid Other | Attending: Emergency Medicine | Admitting: Emergency Medicine

## 2020-09-28 ENCOUNTER — Encounter (HOSPITAL_COMMUNITY): Payer: Self-pay | Admitting: Emergency Medicine

## 2020-09-28 ENCOUNTER — Other Ambulatory Visit: Payer: Self-pay

## 2020-09-28 ENCOUNTER — Emergency Department (HOSPITAL_COMMUNITY): Payer: Medicaid Other

## 2020-09-28 DIAGNOSIS — R197 Diarrhea, unspecified: Secondary | ICD-10-CM | POA: Insufficient documentation

## 2020-09-28 DIAGNOSIS — R1013 Epigastric pain: Secondary | ICD-10-CM

## 2020-09-28 DIAGNOSIS — R202 Paresthesia of skin: Secondary | ICD-10-CM | POA: Insufficient documentation

## 2020-09-28 DIAGNOSIS — I10 Essential (primary) hypertension: Secondary | ICD-10-CM | POA: Insufficient documentation

## 2020-09-28 DIAGNOSIS — Z87442 Personal history of urinary calculi: Secondary | ICD-10-CM | POA: Insufficient documentation

## 2020-09-28 DIAGNOSIS — R079 Chest pain, unspecified: Secondary | ICD-10-CM

## 2020-09-28 DIAGNOSIS — Z87891 Personal history of nicotine dependence: Secondary | ICD-10-CM | POA: Insufficient documentation

## 2020-09-28 DIAGNOSIS — R110 Nausea: Secondary | ICD-10-CM

## 2020-09-28 DIAGNOSIS — R112 Nausea with vomiting, unspecified: Secondary | ICD-10-CM | POA: Insufficient documentation

## 2020-09-28 LAB — URINALYSIS, ROUTINE W REFLEX MICROSCOPIC
Bilirubin Urine: NEGATIVE
Glucose, UA: NEGATIVE mg/dL
Hgb urine dipstick: NEGATIVE
Ketones, ur: 20 mg/dL — AB
Leukocytes,Ua: NEGATIVE
Nitrite: NEGATIVE
Protein, ur: NEGATIVE mg/dL
Specific Gravity, Urine: 1.02 (ref 1.005–1.030)
pH: 6 (ref 5.0–8.0)

## 2020-09-28 LAB — I-STAT BETA HCG BLOOD, ED (MC, WL, AP ONLY): I-stat hCG, quantitative: <5 m[IU]/mL (ref <5)

## 2020-09-28 LAB — CBC
HCT: 34.7 % — ABNORMAL LOW (ref 36.0–46.0)
Hemoglobin: 10.5 g/dL — ABNORMAL LOW (ref 12.0–15.0)
MCH: 25.5 pg — ABNORMAL LOW (ref 26.0–34.0)
MCHC: 30.3 g/dL (ref 30.0–36.0)
MCV: 84.4 fL (ref 80.0–100.0)
Platelets: 238 10*3/uL (ref 150–400)
RBC: 4.11 MIL/uL (ref 3.87–5.11)
RDW: 15.5 % (ref 11.5–15.5)
WBC: 5.1 10*3/uL (ref 4.0–10.5)
nRBC: 0 % (ref 0.0–0.2)

## 2020-09-28 LAB — COMPREHENSIVE METABOLIC PANEL
ALT: 19 U/L (ref 0–44)
AST: 19 U/L (ref 15–41)
Albumin: 3.6 g/dL (ref 3.5–5.0)
Alkaline Phosphatase: 46 U/L (ref 38–126)
Anion gap: 10 (ref 5–15)
BUN: 6 mg/dL (ref 6–20)
CO2: 23 mmol/L (ref 22–32)
Calcium: 9 mg/dL (ref 8.9–10.3)
Chloride: 105 mmol/L (ref 98–111)
Creatinine, Ser: 0.66 mg/dL (ref 0.44–1.00)
GFR, Estimated: >60 mL/min (ref >60)
Glucose, Bld: 100 mg/dL — ABNORMAL HIGH (ref 70–99)
Potassium: 3.6 mmol/L (ref 3.5–5.1)
Sodium: 138 mmol/L (ref 135–145)
Total Bilirubin: 0.6 mg/dL (ref 0.3–1.2)
Total Protein: 6.5 g/dL (ref 6.5–8.1)

## 2020-09-28 LAB — LIPASE, BLOOD: Lipase: 19 U/L (ref 11–51)

## 2020-09-28 LAB — TROPONIN I (HIGH SENSITIVITY)
Troponin I (High Sensitivity): <2 ng/L (ref <18)
Troponin I (High Sensitivity): 2 ng/L (ref <18)

## 2020-09-28 MED ORDER — DICYCLOMINE HCL 20 MG PO TABS: 20.0000 mg | ORAL_TABLET | Freq: Two times a day (BID) | ORAL | 0 refills | Status: DC

## 2020-09-28 MED ORDER — ONDANSETRON 4 MG PO TBDP: 4.0000 mg | ORAL_TABLET | Freq: Three times a day (TID) | ORAL | 0 refills | Status: DC | PRN

## 2020-09-28 MED ORDER — FAMOTIDINE IN NACL 20-0.9 MG/50ML-% IV SOLN
20.0000 mg | Freq: Once | INTRAVENOUS | Status: AC
  Administered 2020-09-28: 20 mg via INTRAVENOUS
  Filled 2020-09-28: qty 50

## 2020-09-28 MED ORDER — SODIUM CHLORIDE 0.9 % IV BOLUS
1000.0000 mL | Freq: Once | INTRAVENOUS | Status: AC
  Administered 2020-09-28: 1000 mL via INTRAVENOUS

## 2020-09-28 MED ORDER — ONDANSETRON HCL 4 MG/2ML IJ SOLN
4.0000 mg | Freq: Once | INTRAMUSCULAR | Status: AC
  Administered 2020-09-28: 4 mg via INTRAVENOUS
  Filled 2020-09-28: qty 2

## 2020-09-28 NOTE — ED Notes (Signed)
Provided pt with a gingerale

## 2020-09-28 NOTE — ED Triage Notes (Signed)
Pt reports pain to center of chest x 1-2 months.  L arm numbness since last night.  States she normally has dry heaves and diarrhea with menstrual cycle but it ended on Thursday and she continues to have dry heaves, vomiting, and diarrhea.

## 2020-09-28 NOTE — ED Provider Notes (Signed)
[facility] EMERGENCY DEPARTMENT Provider Note   CSN: [id number] Arrival date & time: 09/28/20  1287     History Chief Complaint  Patient presents with  . Chest Pain    Kaitlin Jackson is a 29 y.o. female.  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  She states that her left hand occasionally has some paresthesias she describes this as numb but states that she has good sensation.  She also states that she has had intermittent nausea vomiting and diarrhea for the past 3 months.  She states that she has several episodes per day she denies any blood in her stool she denies any fevers and denies any history of or exposure to C. difficile.  She states her abdominal pain is epigastric and has been constant issue for several months now.  Does not seem to be associated with eating.  She denies any history or symptoms of reflux.  She states that she is recently had a menstrual cycle and attributes some of the symptoms of this.  She denies any vaginal discharge dyspareunia or vaginal pain or itching.  She denies any urinary symptoms such as frequency urgency or dysuria or hematuria.  No other associated symptoms.  No aggravating mitigating factors.     Past Medical History:  Diagnosis Date  . Abnormal Pap smear   . Chlamydia   . Hypertension   . Kidney stones   . Obesity   . Renal disorder   . Vaginal Pap smear, abnormal     Patient Active Problem List   Diagnosis Date Noted  . Postpartum care and examination 08/04/2017  . Chronic hypertension 08/04/2017  . Gestational hypertension 06/21/2017  . Anemia in pregnancy 05/12/2017  . Uterine size date discrepancy pregnancy, third trimester 04/13/2017  . Marijuana use 12/07/2016  . Rubella non-immune status, antepartum  12/01/2016  . Obesity in pregnancy, antepartum 11/29/2016    Past Surgical History:  Procedure Laterality Date  . NO PAST SURGERIES       OB History    Gravida  2   Para  1   Term  1   Preterm      AB  1   Living  1     SAB  1   IAB      Ectopic      Multiple  0   Live Births  1           Family History  Problem Relation Age of Onset  . Miscarriages / [place] Mother   . Hypertension Mother   . Asthma Brother   . Diabetes Paternal Grandmother     Social History   Tobacco Use  . Smoking status: Former Smoker    Packs/day: 0.50    Types: Cigarettes    Quit date: 11/14/2016    Years since quitting: 3.8  . Smokeless tobacco: Never Used  Substance Use Topics  . Alcohol use: No  . Drug use: No    Home Medications Prior to Admission medications   Medication Sig Start Date End Date Taking? Authorizing Provider  dicyclomine (BENTYL) 20 MG tablet Take 1 tablet (20 mg total) by mouth 2 (two) times daily. 09/28/20   Gailen Shelter, PA  ondansetron (ZOFRAN ODT) 4 MG disintegrating tablet Take 1 tablet (4 mg total) by mouth every  8 (eight) hours as needed for nausea or vomiting. 09/28/20   Gailen Shelter, PA    Allergies    Patient has no known allergies.  Review of Systems   Review of Systems  Constitutional: Positive for fatigue. Negative for chills and fever.  HENT: Negative for congestion.   Eyes: Negative for pain.  Respiratory: Negative for cough and shortness of breath.   Cardiovascular: Positive for chest pain. Negative for leg swelling.  Gastrointestinal: Positive for abdominal pain, diarrhea, nausea and vomiting.  Genitourinary: Negative for dysuria.  Musculoskeletal: Negative for myalgias.  Skin: Negative for rash.  Neurological: Negative for dizziness and headaches.    Physical Exam Updated Vital Signs BP (!) 149/98 (BP Location: Left Arm)   Pulse 61   Temp 98.1 F (36.7 C) (Oral)   Resp 16   LMP 09/20/2020   SpO2 100%    Physical Exam Vitals and nursing note reviewed.  Constitutional:      General: She is not in acute distress.    Comments: Pleasant well-appearing 29 year old.  In no acute distress.  Sitting comfortably in bed.  Able answer questions appropriately follow commands. No increased work of breathing. Speaking in full sentences.  HENT:     Head: Normocephalic and atraumatic.     Nose: Nose normal.     Mouth/Throat:     Mouth: Mucous membranes are moist.  Eyes:     General: No scleral icterus. Cardiovascular:     Rate and Rhythm: Normal rate and regular rhythm.     Pulses: Normal pulses.     Heart sounds: Normal heart sounds.  Pulmonary:     Effort: Pulmonary effort is normal. No respiratory distress.     Breath sounds: No wheezing.  Abdominal:     Palpations: Abdomen is soft.     Tenderness: There is no abdominal tenderness.  Musculoskeletal:     Cervical back: Normal range of motion.     Right lower leg: No edema.     Left lower leg: No edema.  Skin:    General: Skin is warm and dry.     Capillary Refill: Capillary refill takes less than 2 seconds.  Neurological:     Mental Status: She is alert. Mental status is at baseline.  Psychiatric:        Mood and Affect: Mood normal.        Behavior: Behavior normal.     ED Results / Procedures / Treatments   Labs (all labs ordered are listed, but only abnormal results are displayed) Labs Reviewed  COMPREHENSIVE METABOLIC PANEL - Abnormal; Notable for the following components:      Result Value   Glucose, Bld 100 (*)    All other components within normal limits  CBC - Abnormal; Notable for the following components:   Hemoglobin 10.5 (*)    HCT 34.7 (*)    MCH 25.5 (*)    All other components within normal limits  URINALYSIS, ROUTINE W REFLEX MICROSCOPIC - Abnormal; Notable for the following components:   APPearance HAZY (*)    Ketones, ur 20 (*)    All other components within normal limits  LIPASE, BLOOD  I-STAT BETA HCG  BLOOD, ED ([facility], [facility], [facility] ONLY)  TROPONIN I (HIGH SENSITIVITY)  TROPONIN I (HIGH SENSITIVITY)    EKG EKG Interpretation  Date/Time:  Sunday September 28 2020 09:13:22 EST Ventricular Rate:  66 PR Interval:  190 QRS Duration: 104 QT Interval:  408 QTC Calculation: 427 R Axis:  11 Text Interpretation: Normal sinus rhythm Incomplete right bundle branch block Borderline ECG no prior for comparison  no acute ST segment or t wave changes Confirmed by Marianna Fuss ([id number]) on 09/28/2020 12:38:11 PM Also confirmed by Marianna Fuss ([id number]), editor Elita Quick [phone])  on 09/28/2020 3:05:44 PM   Radiology DG Chest 2 View  Result Date: 09/28/2020 CLINICAL DATA:  Chest pain, vomiting EXAM: CHEST - 2 VIEW COMPARISON:  None. FINDINGS: The heart size and mediastinal contours are within normal limits. Both lungs are clear. The visualized skeletal structures are unremarkable. IMPRESSION: No active cardiopulmonary disease. Electronically Signed   By: Judie Petit.  Shick M.D.   On: 09/28/2020 10:06    Procedures Procedures (including critical care time)  Medications Ordered in ED Medications  sodium chloride 0.9 % bolus 1,000 mL (0 mLs Intravenous Stopped 09/28/20 1625)  ondansetron (ZOFRAN) injection 4 mg (4 mg Intravenous Given 09/28/20 1433)  famotidine (PEPCID) IVPB 20 mg premix (0 mg Intravenous Stopped 09/28/20 1624)    ED Course  I have reviewed the triage vital signs and the nursing notes.  Pertinent labs & imaging results that were available during my care of the patient were reviewed by me and considered in my medical decision making (see chart for details).    MDM Rules/Calculators/A&P                          Patient 29 year old female with past medical history detailed above presented today with several months of chest pain nausea vomiting diarrhea.  She states his symptoms been ongoing for 2 months.  Chest pain is nonexertional nonpleuritic.  She states it has been constant  although it is difficult to assess whether this is sure since it is been ongoing for so long she seems relatively unfazed by this.  Her nausea vomiting abdominal pain are consistent with viral etiology she states that she is having hourly episodes of diarrhea for 2 months which again seems out of proportion to her concern, physical exam, euvolemia, lab work.  On my examination she has no abdominal tenderness.  Lungs are clear in all fields and she is pleasant and appears very comfortable in bed.  Urinalysis without any evidence of infection.  CBC without leukocytosis mild anemia, CMP without any significant electrolyte derangement.  I-STAT Hg negative for pregnancy troponin x2 within normal limits  ED is nonischemic and chest x-ray two-view reviewed by myself shows no acute abnormality.  Patient reassessed after 1 L of crystalloid, Pepcid and Zofran she feels significantly improved.  Has no further questions or complaints at this time.  Will discharge with Bentyl and Zofran and follow-up with PCP.  Final Clinical Impression(s) / ED Diagnoses Final diagnoses:  Chest pain, unspecified type  Epigastric pain  Nausea    Rx / DC Orders ED Discharge Orders         Ordered    dicyclomine (BENTYL) 20 MG tablet  2 times daily        09/28/20 1634    ondansetron (ZOFRAN ODT) 4 MG disintegrating tablet  Every 8 hours PRN        09/28/20 1634           Gailen Shelter, [place] 09/29/20 2246    Milagros Loll, MD 09/30/20 1624

## 2020-09-28 NOTE — Discharge Instructions (Signed)
I prescribed you Bentyl for abdominal pain as well as Zofran for nausea.  Please follow-up with primary care doctor.  Your lab work and exam are reassuring today.   Return to the ER for any new or concerning symptoms otherwise please follow-up with your primary care doctor and your OB/GYN.

## 2020-12-04 ENCOUNTER — Ambulatory Visit: Payer: Medicaid Other | Admitting: Student

## 2020-12-09 ENCOUNTER — Ambulatory Visit: Payer: Medicaid Other | Admitting: Obstetrics and Gynecology

## 2021-01-22 ENCOUNTER — Encounter: Payer: Self-pay | Admitting: Student

## 2021-01-22 ENCOUNTER — Other Ambulatory Visit: Payer: Self-pay

## 2021-01-22 ENCOUNTER — Other Ambulatory Visit (HOSPITAL_COMMUNITY)
Admission: RE | Admit: 2021-01-22 | Discharge: 2021-01-22 | Disposition: A | Discharge status: Home / Self Care | Payer: 59 | Source: Ambulatory Visit | Attending: Student | Admitting: Student

## 2021-01-22 ENCOUNTER — Ambulatory Visit (INDEPENDENT_AMBULATORY_CARE_PROVIDER_SITE_OTHER): Payer: 59 | Admitting: Student

## 2021-01-22 VITALS — BP 164/104 | HR 67 | Ht 65.0 in | Wt 314.7 lb

## 2021-01-22 DIAGNOSIS — Z113 Encounter for screening for infections with a predominantly sexual mode of transmission: Secondary | ICD-10-CM | POA: Insufficient documentation

## 2021-01-22 DIAGNOSIS — Z01419 Encounter for gynecological examination (general) (routine) without abnormal findings: Secondary | ICD-10-CM

## 2021-01-22 MED ORDER — AMLODIPINE BESYLATE 5 MG PO TABS: 5.0000 mg | ORAL_TABLET | Freq: Every day | ORAL | 0 refills | Status: AC

## 2021-01-22 NOTE — Progress Notes (Signed)
  History:  Ms. Kaitlin Jackson is a 30 y.o. G2P1011 who presents to clinic today for gyn visit, STI testing, pap smear and to discuss her blood pressure.   The following portions of the patient's history were reviewed and updated as appropriate: allergies, current medications, family history, past medical history, social history, past surgical history and problem list.  Review of Systems:  Review of Systems  Constitutional: Negative.   HENT: Negative.   Eyes: Negative.   Respiratory: Negative.   Cardiovascular: Negative.   Gastrointestinal: Negative.   Genitourinary: Negative.   Musculoskeletal: Negative.   Skin: Negative.       Objective:  Physical Exam BP (!) 164/104   Pulse 67   Ht 5\' 5"  (1.651 m)   Wt (!) 314 lb 11.2 oz (142.7 kg)   LMP 01/05/2021 (Exact Date)   BMI 52.37 kg/m  Physical Exam Cardiovascular:     Rate and Rhythm: Normal rate.     Pulses: Normal pulses.  Pulmonary:     Effort: Pulmonary effort is normal.  Genitourinary:    General: Normal vulva.     Rectum: Normal.  Musculoskeletal:        General: Normal range of motion.     Cervical back: Normal range of motion.  Skin:    General: Skin is warm and dry.  Neurological:     Mental Status: She is alert.   Breast exam: soft, non-tender, no lumps or masses, equal in size symmetry. No axillary lumps noted.     Labs and Imaging No results found for this or any previous visit (from the past 24 hour(s)).  No results found.   Assessment & Plan:    1. Screen for STD (sexually transmitted disease)   2. Well woman exam with routine gynecological exam   -will do all STI testing, pap smear today.  -BP is elevated; discussed with Dr. 01/07/2021 and will start on Norvasc. Labs pending; if necessary can switch medicines.  -Patient given number for [facility] to establish care at PCP -Smoking cessation discussed -Patient does not contraception at this time  Approximately 30 minutes of total time was  spent with this patient on counseling and coordination of care.   Vergie Living, CNM 01/22/2021 8:55 AM

## 2021-01-22 NOTE — Patient Instructions (Signed)
Call [facility] for annual exam and blood pressure monitoring.    [phone]

## 2021-01-23 LAB — COMPREHENSIVE METABOLIC PANEL
ALT: 7 IU/L (ref 0–32)
AST: 14 IU/L (ref 0–40)
Albumin/Globulin Ratio: 1.5 (ref 1.2–2.2)
Albumin: 4.2 g/dL (ref 3.8–4.8)
Alkaline Phosphatase: 63 IU/L (ref 44–121)
BUN/Creatinine Ratio: 15 (ref 9–23)
BUN: 9 mg/dL (ref 6–20)
Bilirubin Total: 0.3 mg/dL (ref 0.0–1.2)
CO2: 18 mmol/L — ABNORMAL LOW (ref 20–29)
Calcium: 9.2 mg/dL (ref 8.7–10.2)
Chloride: 106 mmol/L (ref 96–106)
Creatinine, Ser: 0.6 mg/dL (ref 0.57–1.00)
Globulin, Total: 2.8 g/dL (ref 1.5–4.5)
Glucose: 82 mg/dL (ref 65–99)
Potassium: 4.4 mmol/L (ref 3.5–5.2)
Sodium: 140 mmol/L (ref 134–144)
Total Protein: 7 g/dL (ref 6.0–8.5)
eGFR: 122 mL/min/{1.73_m2} (ref >59)

## 2021-01-23 LAB — HEPATITIS B SURFACE ANTIGEN: Hepatitis B Surface Ag: NEGATIVE

## 2021-01-23 LAB — HEMOGLOBIN A1C
Est. average glucose Bld gHb Est-mCnc: 117 mg/dL
Hgb A1c MFr Bld: 5.7 % — ABNORMAL HIGH (ref 4.8–5.6)

## 2021-01-23 LAB — CBC
Hematocrit: 34.6 % (ref 34.0–46.6)
Hemoglobin: 10.9 g/dL — ABNORMAL LOW (ref 11.1–15.9)
MCH: 26.1 pg — ABNORMAL LOW (ref 26.6–33.0)
MCHC: 31.5 g/dL (ref 31.5–35.7)
MCV: 83 fL (ref 79–97)
Platelets: 230 10*3/uL (ref 150–450)
RBC: 4.18 x10E6/uL (ref 3.77–5.28)
RDW: 14.6 % (ref 11.7–15.4)
WBC: 5.4 10*3/uL (ref 3.4–10.8)

## 2021-01-23 LAB — LIPID PANEL WITH LDL/HDL RATIO
Cholesterol, Total: 186 mg/dL (ref 100–199)
HDL: 46 mg/dL
LDL Chol Calc (NIH): 129 mg/dL — ABNORMAL HIGH (ref 0–99)
LDL/HDL Ratio: 2.8 ratio (ref 0.0–3.2)
Triglycerides: 60 mg/dL (ref 0–149)
VLDL Cholesterol Cal: 11 mg/dL (ref 5–40)

## 2021-01-23 LAB — CERVICOVAGINAL ANCILLARY ONLY
Bacterial Vaginitis (gardnerella): NEGATIVE
Candida Glabrata: NEGATIVE
Candida Vaginitis: NEGATIVE
Comment: NEGATIVE
Comment: NEGATIVE
Comment: NEGATIVE
Comment: NEGATIVE
Trichomonas: NEGATIVE

## 2021-01-23 LAB — HEPATITIS C ANTIBODY: Hep C Virus Ab: <0.1 s/co ratio (ref 0.0–0.9)

## 2021-01-23 LAB — TSH: TSH: 1.45 u[IU]/mL (ref 0.450–4.500)

## 2021-01-23 LAB — SYPHILIS: RPR W/REFLEX TO RPR TITER AND TREPONEMAL ANTIBODIES, TRADITIONAL SCREENING AND DIAGNOSIS ALGORITHM: RPR Ser Ql: NONREACTIVE

## 2021-01-23 LAB — HIV ANTIBODY (ROUTINE TESTING W REFLEX): HIV Screen 4th Generation wRfx: NONREACTIVE

## 2021-01-27 LAB — CYTOLOGY - PAP
Chlamydia: NEGATIVE
Comment: NEGATIVE
Comment: NEGATIVE
Comment: NEGATIVE
Comment: NORMAL
Diagnosis: NEGATIVE
HSV1: NEGATIVE
HSV2: NEGATIVE
High risk HPV: NEGATIVE
Neisseria Gonorrhea: NEGATIVE

## 2022-01-07 ENCOUNTER — Encounter (HOSPITAL_COMMUNITY): Payer: Self-pay | Admitting: Emergency Medicine

## 2022-01-07 ENCOUNTER — Ambulatory Visit (HOSPITAL_COMMUNITY)
Admission: EM | Admit: 2022-01-07 | Discharge: 2022-01-07 | Disposition: A | Discharge status: Home / Self Care | Payer: 59 | Attending: Nurse Practitioner | Admitting: Nurse Practitioner

## 2022-01-07 DIAGNOSIS — J029 Acute pharyngitis, unspecified: Secondary | ICD-10-CM | POA: Diagnosis not present

## 2022-01-07 LAB — POCT RAPID STREP A, ED / UC: Streptococcus, Group A Screen (Direct): NEGATIVE

## 2022-01-07 LAB — POC INFLUENZA A AND B ANTIGEN (URGENT CARE ONLY)
INFLUENZA A ANTIGEN, POC: NEGATIVE
INFLUENZA B ANTIGEN, POC: NEGATIVE

## 2022-01-07 MED ORDER — AMOXICILLIN 500 MG PO CAPS: 500.0000 mg | ORAL_CAPSULE | Freq: Two times a day (BID) | ORAL | 0 refills | Status: AC

## 2022-01-07 NOTE — ED Provider Notes (Signed)
?[place] ? ? ? ?CSN: [id number] ?Arrival date & time: 01/07/22  1644 ? ? ?  ? ?History   ?Chief Complaint ?Chief Complaint  ?Patient presents with  ? Sore Throat  ? ? ?HPI ?Kaitlin Jackson is a 31 y.o. female.  ? ?The patient is a 31 year old female who presents with sore throat.  Symptoms started 1 to 2 days ago.  She states that she experienced fever and chills on her first day.  She also experienced episodes of vomiting that lasted through the entire day.  She states that over the last 24 hours, she has had worsening sore throat pain.  She states that she has difficulty eating, swallowing, and has neck pain.  She denies headache, cough, nasal congestion, runny nose.  She states that her nausea has since improved.  She continues to have worsening pain in her throat.  Patient tried oregano oil for her symptoms.  She started this on day 1, but she was unable to keep it down.  States that she was able to tolerate the oregano oil today.  She has received 2 COVID vaccines, and her influenza vaccine. ? ? ?Sore Throat ?This is a new problem. The problem occurs constantly. The problem has been gradually worsening. Pertinent negatives include no abdominal pain, no headaches and no shortness of breath. Nothing aggravates the symptoms. Nothing relieves the symptoms.  ? ?Past Medical History:  ?Diagnosis Date  ? Abnormal Pap smear   ? Chlamydia   ? Hypertension   ? Kidney stones   ? Obesity   ? Renal disorder   ? Vaginal Pap smear, abnormal   ? ? ?Patient Active Problem List  ? Diagnosis Date Noted  ? Postpartum care and examination 08/04/2017  ? Chronic hypertension 08/04/2017  ? Gestational hypertension 06/21/2017  ? Anemia in pregnancy 05/12/2017  ? Uterine size date discrepancy pregnancy, third trimester 04/13/2017  ? Marijuana use 12/07/2016  ? Rubella non-immune status, antepartum 12/01/2016  ? Obesity in pregnancy, antepartum 11/29/2016  ? ? ?Past Surgical History:  ?Procedure Laterality Date  ? NO PAST  SURGERIES    ? ? ?OB History   ? ? Gravida  ?2  ? Para  ?1  ? Term  ?1  ? Preterm  ?0  ? AB  ?1  ? Living  ?1  ?  ? ? SAB  ?1  ? IAB  ?0  ? Ectopic  ?0  ? Multiple  ?0  ? Live Births  ?1  ?   ?  ?  ? ? ? ?Home Medications   ? ?Prior to Admission medications   ?Medication Sig Start Date End Date Taking? Authorizing Provider  ?amLODipine (NORVASC) 5 MG tablet Take 1 tablet (5 mg total) by mouth daily. 01/22/21 02/21/21  Starr Lake, CNM  ? ? ?Family History ?Family History  ?Problem Relation Age of Onset  ? Miscarriages / [place] Mother   ? Hypertension Mother   ? Asthma Brother   ? Diabetes Paternal Grandmother   ? Stroke Father   ? Hypertension Father   ? ? ?Social History ?Social History  ? ?Tobacco Use  ? Smoking status: Some Days  ?  Packs/day: 0.50  ?  Types: Cigarettes  ? Smokeless tobacco: Never  ?Vaping Use  ? Vaping Use: Never used  ?Substance Use Topics  ? Alcohol use: No  ? Drug use: No  ? ? ? ?Allergies   ?Patient has no known allergies. ? ? ?Review of Systems ?Review  of Systems  ?Constitutional:  Positive for activity change, appetite change, chills, fatigue and fever.  ?HENT:  Positive for sore throat and trouble swallowing. Negative for congestion, postnasal drip, sinus pressure, sinus pain and sneezing.   ?Eyes: Negative.   ?Respiratory: Negative.  Negative for shortness of breath.   ?Cardiovascular: Negative.   ?Gastrointestinal:  Positive for nausea (now resolved) and vomiting (now resolved). Negative for abdominal pain.  ?Skin: Negative.   ?Neurological:  Negative for headaches.  ?Psychiatric/Behavioral: Negative.    ? ? ?Physical Exam ?Triage Vital Signs ?ED Triage Vitals  ?Enc Vitals Group  ?   BP 01/07/22 1730 (!) 143/93  ?   Pulse Rate 01/07/22 1729 75  ?   Resp 01/07/22 1729 18  ?   Temp 01/07/22 1729 98.3 ?F (36.8 ?C)  ?   Temp Source 01/07/22 1729 Oral  ?   SpO2 01/07/22 1729 98 %  ?   Weight --   ?   Height --   ?   Head Circumference --   ?   Peak Flow --   ?   Pain Score  01/07/22 1728 10  ?   Pain Loc --   ?   Pain Edu? --   ?   Excl. in [place]? --   ? ?No data found. ? ?Updated Vital Signs ?BP (!) 143/93   Pulse 75   Temp 98.3 ?F (36.8 ?C) (Oral)   Resp 18   SpO2 98%   Breastfeeding No  ? ?Visual Acuity ?Right Eye Distance:   ?Left Eye Distance:   ?Bilateral Distance:   ? ?Right Eye Near:   ?Left Eye Near:    ?Bilateral Near:    ? ?Physical Exam ?Vitals reviewed.  ?Constitutional:   ?   General: She is not in acute distress. ?   Appearance: She is well-developed.  ?HENT:  ?   Head: Normocephalic and atraumatic.  ?   Right Ear: Tympanic membrane and ear canal normal.  ?   Left Ear: Tympanic membrane and ear canal normal.  ?   Nose: No congestion or rhinorrhea.  ?   Mouth/Throat:  ?   Mouth: Mucous membranes are moist.  ?   Pharynx: Pharyngeal swelling, oropharyngeal exudate, posterior oropharyngeal erythema and uvula swelling present.  ?   Tonsils: Tonsillar exudate present. 2+ on the right. 2+ on the left.  ?Eyes:  ?   Conjunctiva/sclera: Conjunctivae normal.  ?   Pupils: Pupils are equal, round, and reactive to light.  ?Cardiovascular:  ?   Rate and Rhythm: Normal rate and regular rhythm.  ?   Heart sounds: Normal heart sounds.  ?Pulmonary:  ?   Effort: Pulmonary effort is normal.  ?   Breath sounds: Normal breath sounds.  ?Abdominal:  ?   General: Bowel sounds are normal.  ?   Palpations: Abdomen is soft.  ?Musculoskeletal:  ?   Cervical back: Normal range of motion.  ?Lymphadenopathy:  ?   Cervical: Cervical adenopathy present.  ?Skin: ?   General: Skin is warm and dry.  ?   Capillary Refill: Capillary refill takes less than 2 seconds.  ?Neurological:  ?   General: No focal deficit present.  ?   Mental Status: She is alert and oriented to person, place, and time.  ?Psychiatric:     ?   Mood and Affect: Mood normal.     ?   Behavior: Behavior normal.  ? ? ? ?UC Treatments / Results  ?Labs ?(all labs  ordered are listed, but only abnormal results are displayed) ?Labs Reviewed   ?CULTURE, GROUP A STREP [facility])  ?POC INFLUENZA A AND B ANTIGEN (URGENT CARE ONLY)  ?POCT RAPID STREP A, ED / UC  ? ? ?EKG ? ? ?Radiology ?No results found. ? ?Procedures ?Procedures (including critical care time) ? ?Medications Ordered in UC ?Medications - No data to display ? ?Initial Impression / Assessment and Plan / UC Course  ?I have reviewed the triage vital signs and the nursing notes. ? ?Pertinent labs & imaging results that were available during my care of the patient were reviewed by me and considered in my medical decision making (see chart for details). ? ?Patient is a 31 year old female who presents with sore throat for the past 2 days.  Patient's exam is highly indicative of strep throat, based on +2 tonsil swelling and moderate exudate on both tonsils.  Her strep and influenza test are both negative today.  We will send a throat culture.  In the interim, based on the patient's presentation, physical exam, I am going to go ahead and treat with an antibiotic for her symptoms.  We will also provide viscous lidocaine to help with pain and trouble swallowing.  Will inform patient that she will be contacted if her throat culture is negative and will be informed to stop antibiotics at that time.  Supportive care also recommended to include increasing fluids, getting plenty of rest, and also recommend over-the-counter ibuprofen for pain, fever, or general discomfort.  Ibuprofen will also be helpful for her inflammation and throat swelling.  Patient encouraged to follow-up if symptoms do not improve. ?Final Clinical Impressions(s) / UC Diagnoses  ? ?Final diagnoses:  ?None  ? ?Discharge Instructions   ?None ?  ? ?ED Prescriptions   ?None ?  ? ?PDMP not reviewed this encounter. ?  ?Tish Men, NP ?01/07/22 1833 ? ?

## 2022-01-07 NOTE — ED Triage Notes (Signed)
Pt is present today with tonsillitis and chills. Pt states sx started Wednesday night  ?

## 2022-01-07 NOTE — Discharge Instructions (Signed)
Your test and influenza test are negative. ?I will send a throat culture.  If your culture comes back negative for strep throat, you will be contacted and asked to stop the antibiotic. ?Supportive care to include increasing fluids and getting plenty of rest.  Also recommend over-the-counter ibuprofen to help with pain and inflammation. ?Warm salt water gargles 3-4 times daily to help with pain or discomfort. ?Recommend a soft diet until your symptoms improve. ?Follow-up if symptoms do not improve. ?

## 2022-01-10 LAB — CULTURE, GROUP A STREP (THRC)

## 2022-12-13 ENCOUNTER — Ambulatory Visit: Payer: 59 | Admitting: Nurse Practitioner

## 2022-12-13 ENCOUNTER — Ambulatory Visit: Payer: 59 | Admitting: Obstetrics and Gynecology

## 2022-12-13 NOTE — Progress Notes (Deleted)
ANNUAL EXAM Patient name: Kaitlin Jackson MRN [id number]  Date of birth: Aug 06, 1991 Chief Complaint:   No chief complaint on file.  History of Present Illness:   Kaitlin Jackson is a 32 y.o. G2P1011 being seen today for a routine annual exam.  Current complaints: ***  Menstrual concerns? {yes/no:20286}   Changes urinary habits? {yes/no:20286}  Changes in bowel habits? {yes/no:20286}  Contraception use? {yes/no:20286}  Sexually active? {yes/no:20286}   No LMP recorded.   The pregnancy intention screening data noted above was reviewed. Potential methods of contraception were discussed. The patient elected to proceed with No data recorded.   Last pap     Component Value Date/Time   DIAGPAP  01/22/2021 0926    - Negative for intraepithelial lesion or malignancy (NILM)   HPVHIGH Negative 01/22/2021 0926   ADEQPAP  01/22/2021 0926    Satisfactory for evaluation; transformation zone component PRESENT.    High Risk HPV: Positive  Adequacy:  Satisfactory for evaluation, transformation zone component PRESENT  Diagnosis:  Atypical squamous cells of undetermined significance (ASC-US)  H/O abnormal pap: {yes/yes***/no:23866} Last mammogram: n/a Last colonoscopy: n/a     01/22/2021    8:45 AM 08/04/2017   10:59 AM 06/21/2017    8:56 AM 06/13/2017    2:57 PM 06/06/2017    9:15 AM  Depression screen PHQ 2/9  Decreased Interest 0 0 3 1 0  Down, Depressed, Hopeless 0 2 1 0 0  PHQ - 2 Score 0 '2 4 1 '$ 0  Altered sleeping 0 0 0 0 0  Tired, decreased energy 0 0 1 0 2  Change in appetite 3 3 0 0 0  Feeling bad or failure about yourself  0 0 0 0 0  Trouble concentrating 0 0 0 0 0  Moving slowly or fidgety/restless 0 0 0 0 0  Suicidal thoughts 0 0 0 0 0  PHQ-9 Score '3 5 5 1 2  '$ Difficult doing work/chores Not difficult at all            01/22/2021    8:45 AM 08/04/2017   10:59 AM 06/21/2017    8:57 AM 06/13/2017    2:58 PM  GAD 7 : Generalized Anxiety Score  Nervous, Anxious, on  Edge 2 0 0 0  Control/stop worrying 0 0 0 0  Worry too much - different things 2 0 0 0  Trouble relaxing 0 0 0 0  Restless 0 0 0 0  Easily annoyed or irritable 0 3 0 0  Afraid - awful might happen 0 0 0 0  Total GAD 7 Score 4 3 0 0     Review of Systems:   Pertinent items are noted in HPI Denies any headaches, blurred vision, fatigue, shortness of breath, chest pain, abdominal pain, abnormal vaginal discharge/itching/odor/irritation, problems with periods, bowel movements, urination, or intercourse unless otherwise stated above. Pertinent History Reviewed:  Reviewed past medical,surgical, social and family history.  Reviewed problem list, medications and allergies. Physical Assessment:  There were no vitals filed for this visit.There is no height or weight on file to calculate BMI.        Physical Examination:   General appearance - well appearing, and in no distress  Mental status - alert, oriented to person, place, and time  Psych:  She has a normal mood and affect  Skin - warm and dry, normal color, no suspicious lesions noted  Chest - effort normal, all lung fields clear to auscultation bilaterally  Heart -  normal rate and regular rhythm  Breasts - breasts appear normal, no suspicious masses, no skin or nipple changes or  axillary nodes  Abdomen - soft, nontender, nondistended, no masses or organomegaly  Pelvic -  VULVA: normal appearing vulva with no masses, tenderness or lesions   VAGINA: normal appearing vagina with normal color and discharge, no lesions   CERVIX: normal appearing cervix without discharge or lesions, no CMT  Thin prep pap is {Desc; done/not:10129} *** HR HPV cotesting  UTERUS: uterus is felt to be normal size, shape, consistency and nontender   ADNEXA: No adnexal masses or tenderness noted.  Extremities:  No swelling or varicosities noted  Chaperone present for exam  No results found for this or any previous visit (from the past 24 hour(s)).     Assessment & Plan:  There are no diagnoses linked to this encounter.  Labs/procedures today: ***  Mammogram: {Mammo f/u:25212::"@ 32yo"}, or sooner if problems Colonoscopy: {TCS f/u:25213::"@ 32yo"}, or sooner if problems  No orders of the defined types were placed in this encounter.   Meds: No orders of the defined types were placed in this encounter.   Follow-up: No follow-ups on file.  Darliss Cheney, MD 12/13/2022 1:02 PM

## 2023-03-11 ENCOUNTER — Encounter (HOSPITAL_COMMUNITY): Payer: Self-pay | Admitting: *Deleted

## 2023-03-11 ENCOUNTER — Inpatient Hospital Stay (HOSPITAL_COMMUNITY): Payer: Medicaid Other

## 2023-03-11 ENCOUNTER — Inpatient Hospital Stay (HOSPITAL_COMMUNITY)
Admission: AD | Admit: 2023-03-11 | Discharge: 2023-03-11 | Disposition: A | Discharge status: Home / Self Care | Payer: Medicaid Other | Attending: Obstetrics & Gynecology | Admitting: Obstetrics & Gynecology

## 2023-03-11 DIAGNOSIS — R109 Unspecified abdominal pain: Secondary | ICD-10-CM

## 2023-03-11 DIAGNOSIS — R1030 Lower abdominal pain, unspecified: Secondary | ICD-10-CM | POA: Insufficient documentation

## 2023-03-11 DIAGNOSIS — N760 Acute vaginitis: Secondary | ICD-10-CM

## 2023-03-11 DIAGNOSIS — Z3A01 Less than 8 weeks gestation of pregnancy: Secondary | ICD-10-CM | POA: Diagnosis not present

## 2023-03-11 DIAGNOSIS — O26891 Other specified pregnancy related conditions, first trimester: Secondary | ICD-10-CM | POA: Diagnosis not present

## 2023-03-11 DIAGNOSIS — B9689 Other specified bacterial agents as the cause of diseases classified elsewhere: Secondary | ICD-10-CM | POA: Insufficient documentation

## 2023-03-11 DIAGNOSIS — O23591 Infection of other part of genital tract in pregnancy, first trimester: Secondary | ICD-10-CM | POA: Insufficient documentation

## 2023-03-11 LAB — WET PREP, GENITAL
Sperm: NONE SEEN
Trich, Wet Prep: NONE SEEN
WBC, Wet Prep HPF POC: >=10 — AB (ref <10)
Yeast Wet Prep HPF POC: NONE SEEN

## 2023-03-11 LAB — CBC
HCT: 33.6 % — ABNORMAL LOW (ref 36.0–46.0)
Hemoglobin: 10.4 g/dL — ABNORMAL LOW (ref 12.0–15.0)
MCH: 26.2 pg (ref 26.0–34.0)
MCHC: 31 g/dL (ref 30.0–36.0)
MCV: 84.6 fL (ref 80.0–100.0)
Platelets: 231 10*3/uL (ref 150–400)
RBC: 3.97 MIL/uL (ref 3.87–5.11)
RDW: 16.1 % — ABNORMAL HIGH (ref 11.5–15.5)
WBC: 6.2 10*3/uL (ref 4.0–10.5)
nRBC: 0 % (ref 0.0–0.2)

## 2023-03-11 LAB — URINALYSIS, ROUTINE W REFLEX MICROSCOPIC
Bilirubin Urine: NEGATIVE
Glucose, UA: NEGATIVE mg/dL
Hgb urine dipstick: NEGATIVE
Ketones, ur: NEGATIVE mg/dL
Nitrite: NEGATIVE
Protein, ur: NEGATIVE mg/dL
Specific Gravity, Urine: 1.01 (ref 1.005–1.030)
pH: 7 (ref 5.0–8.0)

## 2023-03-11 LAB — ABO/RH: ABO/RH(D): A POS

## 2023-03-11 LAB — HCG, QUANTITATIVE, PREGNANCY: hCG, Beta Chain, Quant, S: 8512 m[IU]/mL — ABNORMAL HIGH (ref <5)

## 2023-03-11 LAB — POCT PREGNANCY, URINE: Preg Test, Ur: POSITIVE — AB

## 2023-03-11 MED ORDER — METRONIDAZOLE 500 MG PO TABS: 500.0000 mg | ORAL_TABLET | Freq: Two times a day (BID) | ORAL | 0 refills | Status: AC

## 2023-03-11 NOTE — MAU Note (Signed)
.  Kaitlin Jackson is a 32 y.o. at [redacted]w[redacted]d here in MAU reporting: some abd cramping and vaginal discomfort x 1 weekt"stated feels like my vagina is on Fire."  Denies any vag discharge thought she saw some pink spotting this morning but none since.  LMP: 02/04/23 Onset of complaint: 1 week Pain score: 6 Vitals:   03/11/23 1516  BP: 139/81  Pulse: 69  Resp: 18  Temp: 98.5 F (36.9 C)     FHT:n/a Lab orders placed from triage:  U/A UPT

## 2023-03-11 NOTE — MAU Provider Note (Signed)
History     CSN: [id number]  Arrival date and time: 03/11/23 1335   Event Date/Time   First Provider Initiated Contact with Patient 03/11/23 1841      Chief Complaint  Patient presents with   Vaginal Itching   Abdominal Pain   Kaitlin Jackson , a  32 y.o. G3P1011 at [redacted]w[redacted]d presents to MAU with complaints of intermittent lower abdominal pain and spotting for 1 week.. Patient denies attempting to relieve symptoms. She states that she feels like "her vagina is on-fire" but denies abnormal vaginal discharge. Patient denies urinary symptoms. She has no other complaints.      Due to MAU acuity patient triaged and ordered placed from the lobby.   Vaginal Itching The patient's pertinent negatives include no pelvic pain or vaginal discharge. Associated symptoms include abdominal pain. Pertinent negatives include no back pain, chills, constipation, diarrhea, dysuria, fever, headaches, nausea or vomiting.  Abdominal Pain Pertinent negatives include no constipation, diarrhea, dysuria, fever, headaches, nausea or vomiting.    OB History     Gravida  3   Para  1   Term  1   Preterm  0   AB  1   Living  1      SAB  1   IAB  0   Ectopic  0   Multiple  0   Live Births  1           Past Medical History:  Diagnosis Date   Abnormal Pap smear    Chlamydia    Hypertension    Kidney stones    Obesity    Renal disorder    Vaginal Pap smear, abnormal     Past Surgical History:  Procedure Laterality Date   NO PAST SURGERIES      Family History  Problem Relation Age of Onset   Miscarriages / Stillbirths Mother    Hypertension Mother    Asthma Brother    Diabetes Paternal Grandmother    Stroke Father    Hypertension Father     Social History   Tobacco Use   Smoking status: Some Days    Packs/day: .5    Types: Cigarettes   Smokeless tobacco: Never  Vaping Use   Vaping Use: Never used  Substance Use Topics   Alcohol use: No   Drug use: No     Allergies: No Known Allergies  Medications Prior to Admission  Medication Sig Dispense Refill Last Dose   amLODipine (NORVASC) 5 MG tablet Take 1 tablet (5 mg total) by mouth daily. 30 tablet 0     Review of Systems  Constitutional:  Negative for chills, fatigue and fever.  Eyes:  Negative for pain and visual disturbance.  Respiratory:  Negative for apnea, shortness of breath and wheezing.   Cardiovascular:  Negative for chest pain and palpitations.  Gastrointestinal:  Positive for abdominal pain. Negative for constipation, diarrhea, nausea and vomiting.  Genitourinary:  Positive for vaginal bleeding. Negative for difficulty urinating, dysuria, pelvic pain, vaginal discharge and vaginal pain.  Musculoskeletal:  Negative for back pain.  Neurological:  Negative for seizures, weakness and headaches.  Psychiatric/Behavioral:  Negative for suicidal ideas.    Physical Exam   Blood pressure 139/81, pulse 69, temperature 98.5 F (36.9 C), resp. rate 18, height 5\' 5"  (1.651 m), weight (!) 144.7 kg, last menstrual period 02/04/2023.  Physical Exam Vitals and nursing note reviewed.  Constitutional:      General: She is not in acute distress.  Appearance: Normal appearance.  HENT:     Head: Normocephalic.  Pulmonary:     Effort: Pulmonary effort is normal.  Musculoskeletal:     Cervical back: Normal range of motion.  Skin:    General: Skin is warm and dry.  Neurological:     Mental Status: She is alert and oriented to person, place, and time.  Psychiatric:        Mood and Affect: Mood normal.     MAU Course  Procedures Orders Placed This Encounter  Procedures   Wet prep, genital   Culture, OB Urine   US OB LESS THAN 14 WEEKS WITH OB TRANSVAGINAL   Urinalysis, Routine w reflex microscopic -Urine, Clean Catch   CBC   hCG, quantitative, pregnancy   Diet NPO time specified   Pregnancy, urine POC   ABO/Rh   Discharge patient   Results for orders placed or performed  during the hospital encounter of 03/11/23 (from the past 24 hour(s))  Pregnancy, urine POC     Status: Abnormal   Collection Time: 03/11/23  2:39 PM  Result Value Ref Range   Preg Test, Ur POSITIVE (A) NEGATIVE  Urinalysis, Routine w reflex microscopic -Urine, Clean Catch     Status: Abnormal   Collection Time: 03/11/23  2:43 PM  Result Value Ref Range   Color, Urine YELLOW YELLOW   APPearance CLEAR CLEAR   Specific Gravity, Urine 1.010 1.005 - 1.030   pH 7.0 5.0 - 8.0   Glucose, UA NEGATIVE NEGATIVE mg/dL   Hgb urine dipstick NEGATIVE NEGATIVE   Bilirubin Urine NEGATIVE NEGATIVE   Ketones, ur NEGATIVE NEGATIVE mg/dL   Protein, ur NEGATIVE NEGATIVE mg/dL   Nitrite NEGATIVE NEGATIVE   Leukocytes,Ua MODERATE (A) NEGATIVE   RBC / HPF 0-5 0 - 5 RBC/hpf   WBC, UA 6-10 0 - 5 WBC/hpf   Bacteria, UA RARE (A) NONE SEEN   Squamous Epithelial / HPF 0-5 0 - 5 /HPF  Wet prep, genital     Status: Abnormal   Collection Time: 03/11/23  2:43 PM   Specimen: PATH Cytology Cervicovaginal Ancillary Only  Result Value Ref Range   Yeast Wet Prep HPF POC NONE SEEN NONE SEEN   Trich, Wet Prep NONE SEEN NONE SEEN   Clue Cells Wet Prep HPF POC PRESENT (A) NONE SEEN   WBC, Wet Prep HPF POC >=10 (A) <10   Sperm NONE SEEN   CBC     Status: Abnormal   Collection Time: 03/11/23  3:05 PM  Result Value Ref Range   WBC 6.2 4.0 - 10.5 K/uL   RBC 3.97 3.87 - 5.11 MIL/uL   Hemoglobin 10.4 (L) 12.0 - 15.0 g/dL   HCT 78.2 (L) 95.6 - 21.3 %   MCV 84.6 80.0 - 100.0 fL   MCH 26.2 26.0 - 34.0 pg   MCHC 31.0 30.0 - 36.0 g/dL   RDW 08.6 (H) 57.8 - 46.9 %   Platelets 231 150 - 400 K/uL   nRBC 0.0 0.0 - 0.2 %  ABO/Rh     Status: None   Collection Time: 03/11/23  3:05 PM  Result Value Ref Range   ABO/RH(D) A POS    No rh immune globuloin      NOT A RH IMMUNE GLOBULIN CANDIDATE, PT RH POSITIVE Performed at [facility], [address] [place], [place] [address]   hCG, quantitative, pregnancy     Status:  Abnormal   Collection Time: 03/11/23  3:05 PM  Result Value Ref Range   hCG, Beta Chain, Quant, S 8,512 (H) <5 mIU/mL   US OB LESS THAN 14 WEEKS WITH OB TRANSVAGINAL  Result Date: 03/11/2023 CLINICAL DATA:  Abdominal pain. Estimated gestational age of [redacted] weeks, 0 days by LMP. EXAM: OBSTETRIC <14 WK [place] AND TRANSVAGINAL OB US TECHNIQUE: Both transabdominal and transvaginal ultrasound examinations were performed for complete evaluation of the gestation as well as the maternal uterus, adnexal regions, and pelvic cul-de-sac. Transvaginal technique was performed to assess early pregnancy. COMPARISON:  None Available. FINDINGS: Intrauterine gestational sac: Single. Yolk sac:  Visualized. Embryo:  Not Visualized. MSD: 10.7 mm   5 w   6 d Subchorionic hemorrhage:  None visualized. Maternal uterus/adnexae: The ovaries are not visualized. Trace free fluid in the pelvis. IMPRESSION: 1. Early intrauterine gestational sac with a yolk sac, but no fetal pole or cardiac activity yet visualized. Recommend follow-up quantitative B-HCG levels and follow-up US in 14 days to assess viability. This recommendation follows SRU consensus guidelines: Diagnostic Criteria for Nonviable Pregnancy Early in the First Trimester. Malva Limes Med 2013; 161:0960-45. Electronically Signed   By: Obie Dredge M.D.   On: 03/11/2023 18:34    MDM - Hgb 10.4. patient hemodynamically stable at this time.  - Wet prep positive for BV.  - GC pending upon discharge.  - Given pain with leuks and bacteria, UA reflexed to culture  - Quant >8K  - US revealed a Gestational sac and yolk sac measuring about 5 weeks of pregnancy.  - plan for discharge.    Assessment and Plan   1. Less than [redacted] weeks gestation of pregnancy   2. BV (bacterial vaginosis)   3. Abdominal pain, unspecified abdominal location    - Reviewed that bacterial infections like BV can cause abdominal discomfort in pregnancy. - Rx for BV sent to outpatient pharmacy  - Patient has  plans to terminate on 5/30. Pregnancy verification letter provided today in MAU.  - Worsening signs and return precautions reviewed.  - Patient discharged home in stable condition and may return to MAU as needed.   Claudette Head, MSN CNM  03/11/2023, 6:41 PM

## 2023-03-12 LAB — CULTURE, OB URINE

## 2023-03-15 LAB — GC/CHLAMYDIA PROBE AMP (~~LOC~~) NOT AT ARMC
Chlamydia: NEGATIVE
Comment: NEGATIVE
Comment: NORMAL
Neisseria Gonorrhea: NEGATIVE

## 2023-03-17 ENCOUNTER — Ambulatory Visit: Payer: Medicaid Other | Admitting: Nurse Practitioner

## 2023-03-21 ENCOUNTER — Ambulatory Visit: Payer: Medicaid Other | Admitting: Nurse Practitioner

## 2023-08-24 ENCOUNTER — Ambulatory Visit: Payer: Medicaid Other | Admitting: Family Medicine

## 2023-12-28 ENCOUNTER — Ambulatory Visit (HOSPITAL_BASED_OUTPATIENT_CLINIC_OR_DEPARTMENT_OTHER): Payer: Medicaid Other | Admitting: Family Medicine

## 2024-02-24 ENCOUNTER — Encounter (HOSPITAL_COMMUNITY): Payer: Self-pay

## 2024-03-02 ENCOUNTER — Ambulatory Visit: Admitting: Family Medicine

## 2024-04-18 ENCOUNTER — Encounter: Payer: Self-pay | Admitting: General Practice

## 2024-05-31 ENCOUNTER — Ambulatory Visit: Admitting: Family Medicine

## 2024-06-07 ENCOUNTER — Ambulatory Visit: Admitting: Family Medicine

## 2024-07-19 ENCOUNTER — Ambulatory Visit: Admitting: Family Medicine

## 2024-09-06 ENCOUNTER — Ambulatory Visit: Admitting: Family Medicine

## 2024-09-06 ENCOUNTER — Telehealth: Payer: Self-pay | Admitting: General Practice

## 2024-09-06 NOTE — Telephone Encounter (Signed)
 Called pt and left vm to call office back to reschedule missed appt
